# Patient Record
Sex: Female | Born: 1960 | Race: White | Hispanic: No | Marital: Married | Smoking: Never smoker
Health system: Southern US, Community
[De-identification: ages and names within clinical notes are randomized; demographics above are authoritative.]

## PROBLEM LIST (undated history)

## (undated) DIAGNOSIS — J45909 Unspecified asthma, uncomplicated: Secondary | ICD-10-CM

## (undated) DIAGNOSIS — R319 Hematuria, unspecified: Secondary | ICD-10-CM

## (undated) DIAGNOSIS — F439 Reaction to severe stress, unspecified: Secondary | ICD-10-CM

## (undated) DIAGNOSIS — Z6835 Body mass index (BMI) 35.0-35.9, adult: Secondary | ICD-10-CM

## (undated) DIAGNOSIS — R079 Chest pain, unspecified: Secondary | ICD-10-CM

## (undated) DIAGNOSIS — K649 Unspecified hemorrhoids: Secondary | ICD-10-CM

## (undated) DIAGNOSIS — E7800 Pure hypercholesterolemia, unspecified: Secondary | ICD-10-CM

## (undated) DIAGNOSIS — M722 Plantar fascial fibromatosis: Secondary | ICD-10-CM

## (undated) DIAGNOSIS — Z0000 Encounter for general adult medical examination without abnormal findings: Secondary | ICD-10-CM

## (undated) DIAGNOSIS — I10 Essential (primary) hypertension: Secondary | ICD-10-CM

## (undated) DIAGNOSIS — N951 Menopausal and female climacteric states: Secondary | ICD-10-CM

## (undated) DIAGNOSIS — M1990 Unspecified osteoarthritis, unspecified site: Secondary | ICD-10-CM

## (undated) DIAGNOSIS — Z86010 Personal history of colonic polyps: Secondary | ICD-10-CM

## (undated) DIAGNOSIS — R928 Other abnormal and inconclusive findings on diagnostic imaging of breast: Secondary | ICD-10-CM

## (undated) HISTORY — DX: Encounter for general adult medical examination without abnormal findings: Z00.00

## (undated) HISTORY — DX: Unspecified asthma, uncomplicated: J45.909

## (undated) HISTORY — DX: Plantar fascial fibromatosis: M72.2

## (undated) HISTORY — DX: Essential (primary) hypertension: I10

## (undated) HISTORY — DX: Reaction to severe stress, unspecified: F43.9

## (undated) HISTORY — DX: Unspecified osteoarthritis, unspecified site: M19.90

## (undated) HISTORY — DX: Hematuria, unspecified: R31.9

## (undated) HISTORY — DX: Chest pain, unspecified: R07.9

## (undated) HISTORY — DX: Menopausal and female climacteric states: N95.1

## (undated) HISTORY — DX: Pure hypercholesterolemia, unspecified: E78.00

## (undated) HISTORY — DX: Personal history of colonic polyps: Z86.010

## (undated) HISTORY — DX: Other abnormal and inconclusive findings on diagnostic imaging of breast: R92.8

## (undated) HISTORY — DX: Unspecified hemorrhoids: K64.9

## (undated) HISTORY — DX: Body mass index (BMI) 35.0-35.9, adult: Z68.35

---

## 1993-08-01 HISTORY — PX: TUBAL LIGATION: SHX77

## 2004-06-11 ENCOUNTER — Ambulatory Visit: Payer: Self-pay | Admitting: Internal Medicine

## 2004-08-01 HISTORY — PX: EYELID LACERATION REPAIR: SHX5333

## 2005-01-18 ENCOUNTER — Ambulatory Visit: Payer: Self-pay | Admitting: Internal Medicine

## 2006-03-21 ENCOUNTER — Ambulatory Visit: Payer: Self-pay | Admitting: Internal Medicine

## 2007-05-15 ENCOUNTER — Ambulatory Visit: Payer: Self-pay | Admitting: Internal Medicine

## 2008-06-23 ENCOUNTER — Ambulatory Visit: Payer: Self-pay | Admitting: Internal Medicine

## 2008-07-21 ENCOUNTER — Ambulatory Visit: Payer: Self-pay | Admitting: Internal Medicine

## 2010-06-01 ENCOUNTER — Ambulatory Visit: Payer: Self-pay | Admitting: Internal Medicine

## 2011-06-10 ENCOUNTER — Ambulatory Visit: Payer: Self-pay | Admitting: Internal Medicine

## 2011-08-02 HISTORY — PX: COLONOSCOPY: SHX174

## 2012-07-16 ENCOUNTER — Encounter: Payer: Self-pay | Admitting: Internal Medicine

## 2012-07-16 ENCOUNTER — Ambulatory Visit (INDEPENDENT_AMBULATORY_CARE_PROVIDER_SITE_OTHER): Payer: Self-pay | Admitting: Internal Medicine

## 2012-07-16 VITALS — BP 116/74 | HR 86 | Temp 98.4°F | Ht 62.16 in | Wt 181.5 lb

## 2012-07-16 DIAGNOSIS — E7800 Pure hypercholesterolemia, unspecified: Secondary | ICD-10-CM

## 2012-07-16 DIAGNOSIS — Z139 Encounter for screening, unspecified: Secondary | ICD-10-CM

## 2012-07-16 DIAGNOSIS — I10 Essential (primary) hypertension: Secondary | ICD-10-CM

## 2012-07-16 DIAGNOSIS — R5381 Other malaise: Secondary | ICD-10-CM

## 2012-07-16 DIAGNOSIS — R5383 Other fatigue: Secondary | ICD-10-CM

## 2012-07-16 DIAGNOSIS — N951 Menopausal and female climacteric states: Secondary | ICD-10-CM

## 2012-07-16 HISTORY — DX: Pure hypercholesterolemia, unspecified: E78.00

## 2012-07-16 MED ORDER — LEVOFLOXACIN 500 MG PO TABS: 500.0000 mg | ORAL_TABLET | Freq: Every day | ORAL | Status: DC

## 2012-07-16 MED ORDER — ALBUTEROL SULFATE HFA 108 (90 BASE) MCG/ACT IN AERS: 2.0000 | INHALATION_SPRAY | Freq: Four times a day (QID) | RESPIRATORY_TRACT | Status: DC | PRN

## 2012-07-16 MED ORDER — FLUTICASONE PROPIONATE 50 MCG/ACT NA SUSP: NASAL | Status: DC

## 2012-07-16 MED ORDER — SERTRALINE HCL 100 MG PO TABS: 100.0000 mg | ORAL_TABLET | Freq: Every day | ORAL | Status: DC

## 2012-07-16 MED ORDER — MONTELUKAST SODIUM 10 MG PO TABS: 10.0000 mg | ORAL_TABLET | Freq: Every day | ORAL | Status: DC

## 2012-07-16 MED ORDER — HYDROCHLOROTHIAZIDE 25 MG PO TABS: ORAL_TABLET | ORAL | Status: DC

## 2012-07-22 ENCOUNTER — Encounter: Payer: Self-pay | Admitting: Internal Medicine

## 2012-07-22 DIAGNOSIS — N951 Menopausal and female climacteric states: Secondary | ICD-10-CM

## 2012-07-22 DIAGNOSIS — I10 Essential (primary) hypertension: Secondary | ICD-10-CM | POA: Insufficient documentation

## 2012-07-22 HISTORY — DX: Menopausal and female climacteric states: N95.1

## 2012-07-22 NOTE — Assessment & Plan Note (Signed)
Low cholesterol diet and exercise.  Follow.   

## 2012-07-22 NOTE — Assessment & Plan Note (Signed)
On prometrium.  Follow.

## 2012-07-22 NOTE — Progress Notes (Signed)
Subjective:    Patient ID: Ashley Waller, female    DOB: March 16, 1961, 51 y.o.   MRN: [id number]  HPI 51 year old female with past history of hypercholesterolemia who comes in today to follow up on this as well as for a complete physical exam.  She states that starting eight days ago she started having increased sinus issues.  Increased drainage.  Some green and blood tinged sputum.  Feels it is moving down into her chest.  Some tightness and wheezing.  No hemoptysis.  No significant sob.  No fever, nausea or vomiting.  Taking Mucinex and tylenol.  She did get her flu shot.   Past Medical History  Diagnosis Date  . Hypertension   . Hypercholesterolemia   . Asthma   . Hematuria     Current Outpatient Prescriptions on File Prior to Visit  Medication Sig Dispense Refill  . albuterol (PROVENTIL HFA;VENTOLIN HFA) 108 (90 BASE) MCG/ACT inhaler Inhale 2 puffs into the lungs every 6 (six) hours as needed.  6.7 g  5  . fluticasone (FLONASE) 50 MCG/ACT nasal spray Two puffs in each nostril each day  16 g  5  . hydrochlorothiazide (HYDRODIURIL) 25 MG tablet Take 1/2 tablet by mouth daily  30 tablet  5  . loratadine (CLARITIN) 10 MG tablet Take 10 mg by mouth daily.      . montelukast (SINGULAIR) 10 MG tablet Take 1 tablet (10 mg total) by mouth at bedtime.  30 tablet  5  . Progesterone Micronized (PROMETRIUM PO) Take 150 mg by mouth daily.      . sertraline (ZOLOFT) 100 MG tablet Take 1 tablet (100 mg total) by mouth daily.  30 tablet  5    Review of Systems Patient denies any headache, lightheadedness or dizziness. Sinus congestion and drainage as outlined.  Some tightness and wheezing in her chest.   No chest pain or palpitations.  No increased shortness of breath.  No nausea or vomiting.  No abdominal pain or cramping.  No bowel change, such as diarrhea, constipation, BRBPR or melana.  No urine change.        Objective:   Physical Exam Filed Vitals:   07/16/12 0934  BP: 116/74  Pulse: 86   Temp: 98.4 F (90.43 C)   51 year old female in no acute distress.   HEENT:  Nares - clear.  OP- without lesions or erythema.  NECK:  Supple, nontender.  No audible bruit.   HEART:  Appears to be regular. LUNGS:  Without crackles or wheezing audible.  Respirations even and unlabored.   RADIAL PULSE:  Equal bilaterally.  ABDOMEN:  Soft, nontender.  No audible abdominal bruit.   EXTREMITIES:  No increased edema to be present.                     Assessment & Plan:  PROBABLE SINUSITIS/URI.  Will treat with Levaquin 500mg  q day x 10 days.  Saline nasal flushes and Flonase nasal spray as directed.  Albuterol inhaler as directed.  Rest.  Fluids.  Explained to her if symptoms changed, worsened or did not resolve, she was to be reevaluated.   POSTMENOPAUSAL BLEEDING.  Saw GYN ([facility]).  Had biopsy.  No pathologic finding found for her bleeding.  Has not had any reoccurrence.  Follow.    INCREASED PSYCHOSOCIAL STRESSORS.  On Zoloft.  Follow.  Stable.  Doing better.    PREVIOUS HEMATURIA.  Last u/a negative.    CARDIOVASCULAR.  Asymptomatic.  GI.  Colonoscopy 12/22/10 revealed a polyp in the cecum - otherwise normal.  Recommended follow up colonoscopy 12/22/15.    HEALTH MAINTENANCE.  Will schedule a physical next visit.  Schedule mammogram.  Colonoscopy as outlined.

## 2012-07-22 NOTE — Assessment & Plan Note (Signed)
Blood pressure under good control.  Follow metabolic panel.   

## 2012-07-23 ENCOUNTER — Telehealth: Payer: Self-pay | Admitting: Internal Medicine

## 2012-07-23 NOTE — Telephone Encounter (Signed)
Called in refill for progesterone 150mg  q day #30 with 3 refills.

## 2012-08-27 ENCOUNTER — Other Ambulatory Visit: Payer: Self-pay

## 2012-09-03 ENCOUNTER — Encounter: Payer: Self-pay | Admitting: Internal Medicine

## 2012-10-08 ENCOUNTER — Ambulatory Visit: Payer: Self-pay | Admitting: Internal Medicine

## 2012-10-15 ENCOUNTER — Ambulatory Visit: Payer: Self-pay | Admitting: Internal Medicine

## 2012-10-15 DIAGNOSIS — R928 Other abnormal and inconclusive findings on diagnostic imaging of breast: Secondary | ICD-10-CM | POA: Insufficient documentation

## 2012-10-15 HISTORY — DX: Other abnormal and inconclusive findings on diagnostic imaging of breast: R92.8

## 2012-10-18 ENCOUNTER — Telehealth: Payer: Self-pay | Admitting: General Practice

## 2012-10-18 NOTE — Telephone Encounter (Signed)
Called patient to let her know that her mammo findings were fine. Dr. Lorin Picket would like to do a follow-up breast exam on patient in the next 4-6 weeks.(needs an appointment)  Also recommends 6 month f/u for a mammogram. Left detailed message on voice mail.

## 2012-10-18 NOTE — Telephone Encounter (Signed)
Pt called wanting to know the results of a follow up Mammogram and [place]. States that she has not been home and apologizes if you have tried to call. Pleae try pt at [phone]

## 2012-11-14 ENCOUNTER — Encounter: Payer: Self-pay | Admitting: Internal Medicine

## 2012-11-14 ENCOUNTER — Ambulatory Visit (INDEPENDENT_AMBULATORY_CARE_PROVIDER_SITE_OTHER): Payer: BC Managed Care – PPO | Admitting: Internal Medicine

## 2012-11-14 VITALS — BP 110/70 | HR 66 | Temp 98.7°F | Ht 61.0 in | Wt 183.0 lb

## 2012-11-14 DIAGNOSIS — E7800 Pure hypercholesterolemia, unspecified: Secondary | ICD-10-CM

## 2012-11-14 DIAGNOSIS — I10 Essential (primary) hypertension: Secondary | ICD-10-CM

## 2012-11-14 DIAGNOSIS — R928 Other abnormal and inconclusive findings on diagnostic imaging of breast: Secondary | ICD-10-CM

## 2012-11-14 DIAGNOSIS — R5381 Other malaise: Secondary | ICD-10-CM

## 2012-11-14 DIAGNOSIS — N951 Menopausal and female climacteric states: Secondary | ICD-10-CM

## 2012-11-14 DIAGNOSIS — R5383 Other fatigue: Secondary | ICD-10-CM

## 2012-11-14 LAB — COMPREHENSIVE METABOLIC PANEL
Albumin: 4.3 g/dL (ref 3.5–5.2)
Alkaline Phosphatase: 63 U/L (ref 39–117)
CO2: 27 mEq/L (ref 19–32)
Chloride: 102 mEq/L (ref 96–112)
Glucose, Bld: 88 mg/dL (ref 70–99)
Potassium: 3.7 mEq/L (ref 3.5–5.1)
Sodium: 139 mEq/L (ref 135–145)
Total Protein: 7.1 g/dL (ref 6.0–8.3)

## 2012-11-14 LAB — CBC WITH DIFFERENTIAL/PLATELET
Eosinophils Relative: 1.6 % (ref 0.0–5.0)
Lymphocytes Relative: 39.8 % (ref 12.0–46.0)
Monocytes Relative: 7.2 % (ref 3.0–12.0)
Neutrophils Relative %: 50.5 % (ref 43.0–77.0)
Platelets: 325 10*3/uL (ref 150.0–400.0)
WBC: 5.8 10*3/uL (ref 4.5–10.5)

## 2012-11-14 LAB — TSH: TSH: 1.27 u[IU]/mL (ref 0.35–5.50)

## 2012-11-14 LAB — LDL CHOLESTEROL, DIRECT: Direct LDL: 148 mg/dL

## 2012-11-14 LAB — LIPID PANEL
Cholesterol: 221 mg/dL — ABNORMAL HIGH (ref 0–200)
Triglycerides: 168 mg/dL — ABNORMAL HIGH (ref 0.0–149.0)

## 2012-11-17 ENCOUNTER — Encounter: Payer: Self-pay | Admitting: Internal Medicine

## 2012-11-17 NOTE — Assessment & Plan Note (Addendum)
Off prometrium.  Doing relatively well.  Follow.    

## 2012-11-17 NOTE — Progress Notes (Signed)
Subjective:    Patient ID: Ashley Waller, female    DOB: Aug 19, 1960, 52 y.o.   MRN: [id number]  HPI 52 year old female with past history of hypercholesterolemia who comes in today to follow up on this as well as for a complete physical exam.  She states she is doing relatively well.  No cardiac symptoms with increased activity or exertion.  No sob.  No sinus or allergy symptoms.  No acid reflux.  No abdominal pain or cramping.  No bowel change.  She recently had a mammogram - recommended follow up views.  Follow up scan - recommended 6 month follow up.  She reports no change in her breast.  States a co-worker was just diagnosed with breast cancer.  She request a referral to see Dr Lemar Livings to discuss the mammogram findings and get a "second opinion".  Overall she feels ok.     Past Medical History  Diagnosis Date  . Hypertension   . Hypercholesterolemia   . Asthma   . Hematuria     Current Outpatient Prescriptions on File Prior to Visit  Medication Sig Dispense Refill  . albuterol (PROVENTIL HFA;VENTOLIN HFA) 108 (90 BASE) MCG/ACT inhaler Inhale 2 puffs into the lungs every 6 (six) hours as needed.  6.7 g  5  . fluticasone (FLONASE) 50 MCG/ACT nasal spray Two puffs in each nostril each day  16 g  5  . hydrochlorothiazide (HYDRODIURIL) 25 MG tablet Take 1/2 tablet by mouth daily  30 tablet  5  . loratadine (CLARITIN) 10 MG tablet Take 10 mg by mouth daily.      . montelukast (SINGULAIR) 10 MG tablet Take 1 tablet (10 mg total) by mouth at bedtime.  30 tablet  5  . sertraline (ZOLOFT) 100 MG tablet Take 1 tablet (100 mg total) by mouth daily.  30 tablet  5  . Progesterone Micronized (PROMETRIUM PO) Take 150 mg by mouth daily.       No current facility-administered medications on file prior to visit.    Review of Systems Patient denies any headache, lightheadedness or dizziness. No sinus or allergy symptoms.  No chest pain or palpitations.  No increased shortness of breath.  No acid reflux.   No nausea or vomiting.  No abdominal pain or cramping.  No bowel change, such as diarrhea, constipation, BRBPR or melana.  No urine change.  Handling stress relatively well.  Some occasional breast tenderness, but no change in her breast.  Does report some fatigue.      Objective:   Physical Exam  Filed Vitals:   11/14/12 0955  BP: 110/70  Pulse: 66  Temp: 98.7 F (48.34 C)   52 year old female in no acute distress.   HEENT:  Nares- clear.  Oropharynx - without lesions. NECK:  Supple.  Nontender.  No audible bruit.  HEART:  Appears to be regular. LUNGS:  No crackles or wheezing audible.  Respirations even and unlabored.  RADIAL PULSE:  Equal bilaterally.    BREASTS:  No nipple discharge or nipple retraction present.  Could not appreciate any distinct nodules or axillary adenopathy.  ABDOMEN:  Soft, nontender.  Bowel sounds present and normal.  No audible abdominal bruit.   EXTREMITIES:  No increased edema present.  DP pulses palpable and equal bilaterally.             Assessment & Plan:  ABNORMAL MAMMOGRAM.  Initial bilateral mammogram for 2014 recommended follow up views.  Follow up views - recommended 6  month follow up.  No change on exam.  Pt reports a co worker just diagnosed with breast cancer.  She request a referral to see Dr Lemar Livings for evaluation and a "second opinion".    POSTMENOPAUSAL BLEEDING.  Saw GYN ([facility]).  Had biopsy.  No pathologic finding found for her bleeding.  Has not had any reoccurrence.  Follow.    INCREASED PSYCHOSOCIAL STRESSORS.  On Zoloft.  Follow.  Stable.  Doing better.    PREVIOUS HEMATURIA.  Last u/a negative.    CARDIOVASCULAR.  Asymptomatic.    GI.  Colonoscopy 12/22/10 revealed a polyp in the cecum - otherwise normal.  Recommended follow up colonoscopy 12/22/15.    FATIGUE.  Does report some fatigue.  Will check cbc,metc and tsh.   HEALTH MAINTENANCE.  Physical today.  Mammogram as outlined. Colonoscopy as outlined.

## 2012-11-17 NOTE — Assessment & Plan Note (Signed)
Blood pressure under good control.  Follow metabolic panel.   

## 2012-11-17 NOTE — Assessment & Plan Note (Signed)
Low cholesterol diet and exercise.  Follow.   

## 2012-12-04 ENCOUNTER — Ambulatory Visit: Payer: Self-pay | Admitting: General Surgery

## 2012-12-07 ENCOUNTER — Encounter: Payer: Self-pay | Admitting: Internal Medicine

## 2012-12-11 ENCOUNTER — Encounter: Payer: Self-pay | Admitting: Internal Medicine

## 2012-12-27 ENCOUNTER — Ambulatory Visit: Payer: Self-pay | Admitting: General Surgery

## 2013-01-22 ENCOUNTER — Encounter: Payer: Self-pay | Admitting: General Surgery

## 2013-01-22 ENCOUNTER — Ambulatory Visit (INDEPENDENT_AMBULATORY_CARE_PROVIDER_SITE_OTHER): Payer: BC Managed Care – PPO | Admitting: General Surgery

## 2013-01-22 VITALS — BP 130/78 | HR 74 | Resp 12 | Ht 62.0 in | Wt 187.0 lb

## 2013-01-22 DIAGNOSIS — R928 Other abnormal and inconclusive findings on diagnostic imaging of breast: Secondary | ICD-10-CM

## 2013-01-22 NOTE — Patient Instructions (Addendum)
Continue self breast exams. Call office for any new breast issues or concerns.  Follow up right breast mammogram in 6 months, follow up with [facility] if needed or if new issues arise

## 2013-01-22 NOTE — Progress Notes (Signed)
Patient ID: Ashley Waller, female   DOB: [birth year], 52 y.o.   MRN: [id number]  Chief Complaint  Patient presents with  . Other    mammogram    HPI Ashley Waller is a 52 y.o. female Who presents for a breast evaluation referred by Dr. Lorin Picket. The most recent mammogram was done on 10/15/12 cat 4, ultrasound done as well. Patient does perform regular self breast checks and gets regular mammograms done. No family history of breast cancer. States she "can't feel anything" until she "knew where to feel for it".  Denies any breast trauma. She does states she has noticied few knots on her left thigh. She has noticed a small weight gain.  The patient is concerned as she has two co-workers at [employer] who had recently been diagnosed with breast cancer. HPI  Past Medical History  Diagnosis Date  . Hypertension   . Asthma   . Hematuria   . Arthritis   . Hemorrhoid     Past Surgical History  Procedure Laterality Date  . Tubal ligation  1995  . Colonoscopy  2013  . Eyelid laceration repair  2006    Family History  Problem Relation Age of Onset  . Hyperlipidemia Mother   . Diabetes Mother   . Hypertension Mother   . Hyperlipidemia Father   . Diabetes Father   . Hypertension Father   . Mental illness Maternal Grandmother   . Mental illness Maternal Grandfather   . Mental illness Paternal Grandmother   . Mental illness Paternal Grandfather     Social History History  Substance Use Topics  . Smoking status: Never Smoker   . Smokeless tobacco: Never Used  . Alcohol Use: 1.0 oz/week    2 drink(s) per week     Comment: ocassionally    Allergies  Allergen Reactions  . Penicillins     Current Outpatient Prescriptions  Medication Sig Dispense Refill  . acetaminophen (TYLENOL) 325 MG tablet Take 650 mg by mouth every 6 (six) hours as needed for pain.      Marland Kitchen albuterol (PROVENTIL HFA;VENTOLIN HFA) 108 (90 BASE) MCG/ACT inhaler Inhale 2 puffs into the lungs every 6 (six) hours as  needed.  6.7 g  5  . fluticasone (FLONASE) 50 MCG/ACT nasal spray Two puffs in each nostril each day  16 g  5  . hydrochlorothiazide (HYDRODIURIL) 25 MG tablet Take 1/2 tablet by mouth daily  30 tablet  5  . loratadine (CLARITIN) 10 MG tablet Take 10 mg by mouth daily.      . montelukast (SINGULAIR) 10 MG tablet Take 1 tablet (10 mg total) by mouth at bedtime.  30 tablet  5  . sertraline (ZOLOFT) 100 MG tablet Take 1 tablet (100 mg total) by mouth daily.  30 tablet  5   No current facility-administered medications for this visit.    Review of Systems Review of Systems  Constitutional: Negative.   Respiratory: Negative.   Cardiovascular: Negative.     Blood pressure 130/78, pulse 74, resp. rate 12, height 5\' 2"  (1.575 m), weight 187 lb (84.823 kg), last menstrual period 07/16/2009.  Physical Exam Physical Exam  Constitutional: She is oriented to person, place, and time. She appears well-developed and well-nourished.  Cardiovascular: Normal rate and regular rhythm.   Pulmonary/Chest: Effort normal and breath sounds normal. Right breast exhibits no inverted nipple, no mass, no nipple discharge, no skin change and no tenderness. Left breast exhibits no inverted nipple, no mass, no nipple discharge,  no skin change and no tenderness.  Lymphadenopathy:    She has no cervical adenopathy.    She has no axillary adenopathy.  Neurological: She is alert and oriented to person, place, and time.  Skin: Skin is warm and dry.  Multiple sub cm nodules anterior thigh left > right consistent with lipomas.    Data Reviewed Mammograms and ultrasound for March 2014 were reviewed. Ultrasound showed multiple hyperechoic lesions in the axilla measuring up to 11 mm thought to represent normal lymph nodes. Six-month reassessment with mammography was recommended.  Assessment    Benign clinical exam. Negative mammogram. Newly identified axillary lymph nodes likely secondary to improve technique.     Plan    I see no evidence of primary breast pathology. Recommendation for followup mammogram in September 2014 is reasonable. This will be deferred to her primary care physician. She is welcome to return at a time if there is persistent questions regarding the normal size x-ray nose recently identified Raney other breast lesions.       Ashley Waller 01/23/2013, 6:55 AM

## 2013-01-23 ENCOUNTER — Encounter: Payer: Self-pay | Admitting: General Surgery

## 2013-02-18 ENCOUNTER — Encounter: Payer: Self-pay | Admitting: Internal Medicine

## 2013-02-18 ENCOUNTER — Ambulatory Visit (INDEPENDENT_AMBULATORY_CARE_PROVIDER_SITE_OTHER): Payer: BC Managed Care – PPO | Admitting: Internal Medicine

## 2013-02-18 VITALS — BP 110/70 | HR 77 | Temp 98.6°F | Ht 61.0 in | Wt 184.5 lb

## 2013-02-18 DIAGNOSIS — N951 Menopausal and female climacteric states: Secondary | ICD-10-CM

## 2013-02-18 DIAGNOSIS — E7800 Pure hypercholesterolemia, unspecified: Secondary | ICD-10-CM

## 2013-02-18 DIAGNOSIS — I10 Essential (primary) hypertension: Secondary | ICD-10-CM

## 2013-02-18 DIAGNOSIS — R928 Other abnormal and inconclusive findings on diagnostic imaging of breast: Secondary | ICD-10-CM

## 2013-02-18 NOTE — Assessment & Plan Note (Signed)
Blood pressure under good control.  Follow metabolic panel.   

## 2013-02-18 NOTE — Assessment & Plan Note (Signed)
Last mammogram 10/15/12 (and ultrasound).  Recommended a f/u mammogram in 9/14.  Pt declines.  Wants to monitor.

## 2013-02-18 NOTE — Assessment & Plan Note (Signed)
Low cholesterol diet and exercise.  Follow.   

## 2013-02-18 NOTE — Progress Notes (Signed)
Subjective:    Patient ID: Ashley Waller, female    DOB: Jun 16, 1961, 52 y.o.   MRN: [id number]  HPI 52 year old female with past history of hypercholesterolemia who comes in today for a scheduled follow up.  She states she is doing relatively well.  No cardiac symptoms with increased activity or exertion.  No sob.  No sinus or allergy symptoms.  No acid reflux.  No abdominal pain or cramping.  No bowel change.  She recently had a mammogram - recommended follow up views.  Follow up scan - recommended 6 month follow up.  She reports no change in her breast.  She saw Dr Lemar Livings.  Recommended a f/u mammogram in 9/14.  Discussed with her today.  She declined scan in 9/14.  Discussed need for monitoring diet and following cholesterol.      Past Medical History  Diagnosis Date  . Hypertension   . Asthma   . Hematuria   . Arthritis   . Hemorrhoid     Current Outpatient Prescriptions on File Prior to Visit  Medication Sig Dispense Refill  . acetaminophen (TYLENOL) 325 MG tablet Take 650 mg by mouth every 6 (six) hours as needed for pain.      Marland Kitchen albuterol (PROVENTIL HFA;VENTOLIN HFA) 108 (90 BASE) MCG/ACT inhaler Inhale 2 puffs into the lungs every 6 (six) hours as needed.  6.7 g  5  . fluticasone (FLONASE) 50 MCG/ACT nasal spray Two puffs in each nostril each day  16 g  5  . hydrochlorothiazide (HYDRODIURIL) 25 MG tablet Take 1/2 tablet by mouth daily  30 tablet  5  . loratadine (CLARITIN) 10 MG tablet Take 10 mg by mouth daily.      . montelukast (SINGULAIR) 10 MG tablet Take 1 tablet (10 mg total) by mouth at bedtime.  30 tablet  5  . sertraline (ZOLOFT) 100 MG tablet Take 1 tablet (100 mg total) by mouth daily.  30 tablet  5   No current facility-administered medications on file prior to visit.    Review of Systems Patient denies any headache, lightheadedness or dizziness. No sinus or allergy symptoms.  No chest pain or palpitations.  No increased shortness of breath.  No acid reflux.  No  nausea or vomiting.  No abdominal pain or cramping.  No bowel change, such as diarrhea, constipation, BRBPR or melana.  No urine change.  Handling stress relatively well. Overall she feels she is doing well.      Objective:   Physical Exam  Filed Vitals:   02/18/13 0942  BP: 110/70  Pulse: 77  Temp: 98.6 F (33 C)   52 year old female in no acute distress.   HEENT:  Nares- clear.  Oropharynx - without lesions. NECK:  Supple.  Nontender.  No audible bruit.  HEART:  Appears to be regular. LUNGS:  No crackles or wheezing audible.  Respirations even and unlabored.  RADIAL PULSE:  Equal bilaterally. ABDOMEN:  Soft, nontender.  Bowel sounds present and normal.  No audible abdominal bruit.   EXTREMITIES:  No increased edema present.  DP pulses palpable and equal bilaterally.             Assessment & Plan:  ABNORMAL MAMMOGRAM.  Initial bilateral mammogram for 2014 recommended follow up views.  Follow up views - recommended 6 month follow up.  (last mammogram and ultrasound 10/15/12).  Saw Dr Lemar Livings.  Recommended a f/u mammogram in 9/14.  Pt declines.  Wants to monitor.  POSTMENOPAUSAL BLEEDING.  Saw GYN ([facility]).  Had biopsy.  No pathologic finding found for her bleeding.  Has not had any reoccurrence.  Follow.    INCREASED PSYCHOSOCIAL STRESSORS.  On Zoloft.  Follow.  Stable.  Doing better.    PREVIOUS HEMATURIA.  Last u/a negative.    CARDIOVASCULAR.  Asymptomatic.    GI.  Colonoscopy 12/22/10 revealed a polyp in the cecum - otherwise normal.  Recommended follow up colonoscopy 12/22/15.  HEALTH MAINTENANCE.  Physical 11/14/12.  Mammogram as outlined. Colonoscopy as outlined.

## 2013-02-18 NOTE — Assessment & Plan Note (Signed)
Off prometrium.  Doing relatively well.  Follow.    

## 2013-03-05 ENCOUNTER — Encounter: Payer: Self-pay | Admitting: Internal Medicine

## 2013-03-15 ENCOUNTER — Other Ambulatory Visit: Payer: Self-pay | Admitting: Internal Medicine

## 2013-05-15 ENCOUNTER — Telehealth: Payer: Self-pay | Admitting: Internal Medicine

## 2013-05-15 NOTE — Telephone Encounter (Signed)
Heather from [facility] called stating the patient needed a 6 month f/u to check her R breast with the nodular area that was found on her imaging prior. I called the patient to make her aware of the apt I scheduled with [facility]. Pt refused apt. She does not want to proceed.

## 2013-05-16 NOTE — Telephone Encounter (Signed)
noted 

## 2013-05-16 NOTE — Telephone Encounter (Signed)
See my office note.  Pt declines f/u mammo.  Will discuss with her again at her upcoming appt.

## 2013-07-15 ENCOUNTER — Other Ambulatory Visit (INDEPENDENT_AMBULATORY_CARE_PROVIDER_SITE_OTHER): Payer: BC Managed Care – PPO

## 2013-07-15 ENCOUNTER — Encounter: Payer: Self-pay | Admitting: Internal Medicine

## 2013-07-15 ENCOUNTER — Other Ambulatory Visit: Payer: Self-pay | Admitting: Internal Medicine

## 2013-07-15 ENCOUNTER — Ambulatory Visit (INDEPENDENT_AMBULATORY_CARE_PROVIDER_SITE_OTHER): Payer: BC Managed Care – PPO | Admitting: Internal Medicine

## 2013-07-15 VITALS — BP 110/70 | HR 76 | Temp 98.4°F | Ht 61.0 in | Wt 183.2 lb

## 2013-07-15 DIAGNOSIS — N951 Menopausal and female climacteric states: Secondary | ICD-10-CM

## 2013-07-15 DIAGNOSIS — R928 Other abnormal and inconclusive findings on diagnostic imaging of breast: Secondary | ICD-10-CM

## 2013-07-15 DIAGNOSIS — I10 Essential (primary) hypertension: Secondary | ICD-10-CM

## 2013-07-15 DIAGNOSIS — M722 Plantar fascial fibromatosis: Secondary | ICD-10-CM

## 2013-07-15 DIAGNOSIS — E7800 Pure hypercholesterolemia, unspecified: Secondary | ICD-10-CM

## 2013-07-15 HISTORY — DX: Plantar fascial fibromatosis: M72.2

## 2013-07-15 LAB — COMPREHENSIVE METABOLIC PANEL
ALT: 15 U/L (ref 0–35)
AST: 17 U/L (ref 0–37)
Albumin: 4.5 g/dL (ref 3.5–5.2)
CO2: 29 mEq/L (ref 19–32)
Calcium: 9.3 mg/dL (ref 8.4–10.5)
Chloride: 101 mEq/L (ref 96–112)
GFR: 104.32 mL/min (ref >60.00)
Potassium: 3.8 mEq/L (ref 3.5–5.1)
Sodium: 138 mEq/L (ref 135–145)
Total Protein: 7.3 g/dL (ref 6.0–8.3)

## 2013-07-15 LAB — LIPID PANEL
Cholesterol: 245 mg/dL — ABNORMAL HIGH (ref 0–200)
Total CHOL/HDL Ratio: 5

## 2013-07-15 NOTE — Assessment & Plan Note (Addendum)
Off prometrium.  Doing relatively well.  Follow.    

## 2013-07-15 NOTE — Assessment & Plan Note (Addendum)
Blood pressure under good control.  Follow metabolic panel.   

## 2013-07-15 NOTE — Assessment & Plan Note (Addendum)
Last mammogram 10/15/12 (and ultrasound).  Recommended a f/u mammogram in 9/14.  Pt declined.  Wanted to monitor.  Agreed to bilateral mammogram in 3/15.  Diagnostic mammogram ordered.

## 2013-07-15 NOTE — Progress Notes (Signed)
Subjective:    Patient ID: Ashley Waller, female    DOB: Jul 13, 1961, 52 y.o.   MRN: [id number]  HPI 52 year old female with past history of hypercholesterolemia who comes in today for a scheduled follow up.  She states she is doing relatively well.  No cardiac symptoms with increased activity or exertion.  No sob.  No sinus or allergy symptoms.  No acid reflux.  No abdominal pain or cramping.  No bowel change.  Last mammogram - recommended follow up views.  Follow up scan - recommended 6 month follow up.  She reports no change in her breast.  She saw Dr Lemar Livings.  Recommended a f/u mammogram in 9/14.  She declined scan in 9/14.  Agreed to f/u scan in 3/15.  Some mucus in her throat.  Just started this am.  No other symptoms.  Had problems with plantar fasciitis - left foot.  Saw Dr Alberteen Spindle.  Injection helped.  Doing stretches.      Past Medical History  Diagnosis Date  . Hypertension   . Asthma   . Hematuria   . Arthritis   . Hemorrhoid     Current Outpatient Prescriptions on File Prior to Visit  Medication Sig Dispense Refill  . acetaminophen (TYLENOL) 325 MG tablet Take 650 mg by mouth every 6 (six) hours as needed for pain.      Marland Kitchen albuterol (PROVENTIL HFA;VENTOLIN HFA) 108 (90 BASE) MCG/ACT inhaler Inhale 2 puffs into the lungs every 6 (six) hours as needed.  6.7 g  5  . fluticasone (FLONASE) 50 MCG/ACT nasal spray Two puffs in each nostril each day  16 g  5  . hydrochlorothiazide (HYDRODIURIL) 25 MG tablet Take 1/2 tablet by mouth daily  30 tablet  5  . loratadine (CLARITIN) 10 MG tablet Take 10 mg by mouth daily.      . montelukast (SINGULAIR) 10 MG tablet TAKE ONE TABLET AT BEDTIME  30 tablet  5  . sertraline (ZOLOFT) 100 MG tablet Take 1 tablet (100 mg total) by mouth daily.  30 tablet  5   No current facility-administered medications on file prior to visit.    Review of Systems Patient denies any headache, lightheadedness or dizziness. No sinus or allergy symptoms.  No chest pain or  palpitations.  No increased shortness of breath.  No acid reflux.  No nausea or vomiting.  No abdominal pain or cramping.  No bowel change, such as diarrhea, constipation, BRBPR or melana.  No urine change.  Handling stress relatively well. Overall she feels she is doing well.      Objective:   Physical Exam  Filed Vitals:   07/15/13 0920  BP: 110/70  Pulse: 76  Temp: 98.4 F (36.9 C)   Blood pressure recheck:  118/78, pulse 97  52 year old female in no acute distress.   HEENT:  Nares- clear.  Oropharynx - without lesions. NECK:  Supple.  Nontender.  No audible bruit.  HEART:  Appears to be regular. LUNGS:  No crackles or wheezing audible.  Respirations even and unlabored.  RADIAL PULSE:  Equal bilaterally. ABDOMEN:  Soft, nontender.  Bowel sounds present and normal.  No audible abdominal bruit.   EXTREMITIES:  No increased edema present.  DP pulses palpable and equal bilaterally.             Assessment & Plan:  ABNORMAL MAMMOGRAM.  Initial bilateral mammogram for 2014 recommended follow up views.  Follow up views - recommended 6 month follow  up.  (last mammogram and ultrasound 10/15/12).  Saw Dr Lemar Livings.  Recommended a f/u mammogram in 9/14.  Pt declined.  Wanted to monitor.  Agreeable to f/u mammogram (bilateral) in 3/15.  Ordered.   POSTMENOPAUSAL BLEEDING.  Saw GYN ([facility]).  Had biopsy.  No pathologic finding found for her bleeding.  Has not had any reoccurrence.  Follow.    INCREASED PSYCHOSOCIAL STRESSORS.  On Zoloft.  Follow.  Stable.  Doing better.    PREVIOUS HEMATURIA.  Last u/a negative.    CARDIOVASCULAR.  Asymptomatic.    GI.  Colonoscopy 12/22/10 revealed a polyp in the cecum - otherwise normal.  Recommended follow up colonoscopy 12/22/15.  HEALTH MAINTENANCE.  Physical 11/14/12.  Mammogram as outlined.  Colonoscopy as outlined.

## 2013-07-15 NOTE — Assessment & Plan Note (Signed)
Saw Dr Alberteen Spindle.  Had injection.  Doing better.  Follow.

## 2013-07-15 NOTE — Progress Notes (Signed)
Pre-visit discussion using our clinic review tool. No additional management support is needed unless otherwise documented below in the visit note.  

## 2013-07-15 NOTE — Progress Notes (Signed)
Orders placed for f/u labs.  

## 2013-07-15 NOTE — Assessment & Plan Note (Addendum)
Low cholesterol diet and exercise.  Follow.   

## 2013-07-23 ENCOUNTER — Other Ambulatory Visit: Payer: Self-pay | Admitting: Internal Medicine

## 2013-09-27 ENCOUNTER — Other Ambulatory Visit: Payer: Self-pay | Admitting: Internal Medicine

## 2013-10-03 ENCOUNTER — Other Ambulatory Visit: Payer: Self-pay | Admitting: Internal Medicine

## 2013-10-21 ENCOUNTER — Ambulatory Visit: Payer: Self-pay | Admitting: Internal Medicine

## 2013-10-21 ENCOUNTER — Encounter: Payer: Self-pay | Admitting: Internal Medicine

## 2013-10-21 LAB — HM MAMMOGRAPHY: HM Mammogram: NEGATIVE

## 2013-11-18 ENCOUNTER — Encounter: Payer: Self-pay | Admitting: Internal Medicine

## 2013-11-18 ENCOUNTER — Ambulatory Visit (INDEPENDENT_AMBULATORY_CARE_PROVIDER_SITE_OTHER): Payer: BC Managed Care – PPO | Admitting: Internal Medicine

## 2013-11-18 VITALS — BP 110/80 | HR 71 | Temp 98.3°F | Ht 61.0 in | Wt 184.8 lb

## 2013-11-18 DIAGNOSIS — R928 Other abnormal and inconclusive findings on diagnostic imaging of breast: Secondary | ICD-10-CM

## 2013-11-18 DIAGNOSIS — I10 Essential (primary) hypertension: Secondary | ICD-10-CM

## 2013-11-18 DIAGNOSIS — N951 Menopausal and female climacteric states: Secondary | ICD-10-CM

## 2013-11-18 DIAGNOSIS — E7800 Pure hypercholesterolemia, unspecified: Secondary | ICD-10-CM

## 2013-11-18 NOTE — Progress Notes (Signed)
Subjective:    Patient ID: Ashley Waller, female    DOB: 1961-05-02, 54 y.o.   MRN: [id number]  HPI 53 year old female with past history of hypercholesterolemia who comes in today for her complete physical exam.  She states she is doing relatively well.  No cardiac symptoms with increased activity or exertion.  No sob.  No sinus or allergy symptoms.  No acid reflux.  No abdominal pain or cramping.  No bowel change.  Last mammogram - recommended follow up views.  Follow up scan - recommended 6 month follow up.  She declined scan in 9/14.  Agreed to f/u scan in 3/15.  This checked out fine.     Past Medical History  Diagnosis Date  . Hypertension   . Asthma   . Hematuria   . Arthritis   . Hemorrhoid     Current Outpatient Prescriptions on File Prior to Visit  Medication Sig Dispense Refill  . acetaminophen (TYLENOL) 325 MG tablet Take 650 mg by mouth every 6 (six) hours as needed for pain.      Marland Kitchen albuterol (PROVENTIL HFA;VENTOLIN HFA) 108 (90 BASE) MCG/ACT inhaler Inhale 2 puffs into the lungs every 6 (six) hours as needed.  6.7 g  5  . fluticasone (FLONASE) 50 MCG/ACT nasal spray TWO PUFFS IN EACH NOSTRIL ONCE A DAY  16 g  5  . hydrochlorothiazide (HYDRODIURIL) 25 MG tablet TAKE ONE-HALF (1/2) TABLET DAILY  30 tablet  5  . loratadine (CLARITIN) 10 MG tablet Take 10 mg by mouth daily.      . montelukast (SINGULAIR) 10 MG tablet TAKE ONE TABLET AT BEDTIME  30 tablet  5  . sertraline (ZOLOFT) 100 MG tablet TAKE ONE (1) TABLET EACH DAY  30 tablet  2   No current facility-administered medications on file prior to visit.    Review of Systems Patient denies any headache, lightheadedness or dizziness. No sinus or allergy symptoms.  No chest pain or palpitations. No increased shortness of breath.  No acid reflux.  No nausea or vomiting.  No abdominal pain or cramping.  No bowel change, such as diarrhea, constipation, BRBPR or melana.  No urine change.  Handling stress relatively well. Overall she  feels she is doing well.      Objective:   Physical Exam  Filed Vitals:   11/18/13 1330  BP: 110/80  Pulse: 71  Temp: 98.3 F (80.39 C)   53 year old female in no acute distress.   HEENT:  Nares- clear.  Oropharynx - without lesions. NECK:  Supple.  Nontender.  No audible bruit.  HEART:  Appears to be regular. LUNGS:  No crackles or wheezing audible.  Respirations even and unlabored.  RADIAL PULSE:  Equal bilaterally.    BREASTS:  No nipple discharge or nipple retraction present.  Could not appreciate any distinct nodules or axillary adenopathy.  ABDOMEN:  Soft, nontender.  Bowel sounds present and normal.  No audible abdominal bruit.  GU:  Not performed.     EXTREMITIES:  No increased edema present.  DP pulses palpable and equal bilaterally.             Assessment & Plan:  ABNORMAL MAMMOGRAM.  Initial bilateral mammogram for 2014 recommended follow up views.  Follow up views - recommended 6 month follow up.  (last mammogram and ultrasound 10/15/12).  Saw Dr Bary Castilla.  Recommended a f/u mammogram in 9/14.  Pt declined.  Wanted to monitor.  Agreeable to f/u mammogram (bilateral) in  3/15.  3/15 mammogram - ok.    POSTMENOPAUSAL BLEEDING.  Saw GYN ([facility]).  Had biopsy.  No pathologic finding found for her bleeding.  Has not had any reoccurrence.  Follow.    INCREASED PSYCHOSOCIAL STRESSORS.  On Zoloft.  Follow.  Stable.  Doing better.    PREVIOUS HEMATURIA.  Last u/a negative.    CARDIOVASCULAR.  Asymptomatic.    GI.  Colonoscopy 12/22/10 revealed a polyp in the cecum - otherwise normal.  Recommended follow up colonoscopy 12/22/15.  HEALTH MAINTENANCE.  Physical today.  Mammogram as outlined.  Colonoscopy as outlined.

## 2013-11-20 ENCOUNTER — Encounter: Payer: Self-pay | Admitting: Internal Medicine

## 2013-11-20 NOTE — Assessment & Plan Note (Signed)
Blood pressure under good control.  Follow metabolic panel.   

## 2013-11-20 NOTE — Assessment & Plan Note (Signed)
Off prometrium.  Doing relatively well.  Follow.    

## 2013-11-20 NOTE — Assessment & Plan Note (Signed)
Last mammogram 10/15/12 (and ultrasound).  Recommended a f/u mammogram in 9/14.  Pt declined.  Wanted to monitor.  Agreed to bilateral mammogram in 3/15.  Diagnostic mammogram 3/15 - ok.

## 2013-11-20 NOTE — Assessment & Plan Note (Signed)
Low cholesterol diet and exercise.  Follow.   

## 2013-11-27 ENCOUNTER — Other Ambulatory Visit: Payer: Self-pay | Admitting: Internal Medicine

## 2014-02-26 ENCOUNTER — Other Ambulatory Visit: Payer: BC Managed Care – PPO

## 2014-03-14 ENCOUNTER — Other Ambulatory Visit: Payer: Self-pay | Admitting: Internal Medicine

## 2014-03-24 ENCOUNTER — Ambulatory Visit: Payer: BC Managed Care – PPO | Admitting: Internal Medicine

## 2014-05-16 ENCOUNTER — Ambulatory Visit (INDEPENDENT_AMBULATORY_CARE_PROVIDER_SITE_OTHER): Payer: BC Managed Care – PPO | Admitting: Internal Medicine

## 2014-05-16 ENCOUNTER — Encounter: Payer: Self-pay | Admitting: Internal Medicine

## 2014-05-16 VITALS — BP 120/70 | HR 84 | Temp 98.4°F | Ht 61.0 in | Wt 189.0 lb

## 2014-05-16 DIAGNOSIS — F439 Reaction to severe stress, unspecified: Secondary | ICD-10-CM

## 2014-05-16 DIAGNOSIS — E669 Obesity, unspecified: Secondary | ICD-10-CM

## 2014-05-16 DIAGNOSIS — N951 Menopausal and female climacteric states: Secondary | ICD-10-CM

## 2014-05-16 DIAGNOSIS — E7800 Pure hypercholesterolemia, unspecified: Secondary | ICD-10-CM

## 2014-05-16 DIAGNOSIS — Z658 Other specified problems related to psychosocial circumstances: Secondary | ICD-10-CM

## 2014-05-16 DIAGNOSIS — R928 Other abnormal and inconclusive findings on diagnostic imaging of breast: Secondary | ICD-10-CM

## 2014-05-16 DIAGNOSIS — I10 Essential (primary) hypertension: Secondary | ICD-10-CM

## 2014-05-16 NOTE — Progress Notes (Signed)
Pre visit review using our clinic review tool, if applicable. No additional management support is needed unless otherwise documented below in the visit note. 

## 2014-05-17 ENCOUNTER — Encounter: Payer: Self-pay | Admitting: Internal Medicine

## 2014-05-17 ENCOUNTER — Other Ambulatory Visit: Payer: Self-pay | Admitting: Internal Medicine

## 2014-05-17 DIAGNOSIS — F439 Reaction to severe stress, unspecified: Secondary | ICD-10-CM | POA: Insufficient documentation

## 2014-05-17 HISTORY — DX: Reaction to severe stress, unspecified: F43.9

## 2014-05-17 MED ORDER — MONTELUKAST SODIUM 10 MG PO TABS: 10.0000 mg | ORAL_TABLET | Freq: Every day | ORAL | Status: DC

## 2014-05-17 MED ORDER — SERTRALINE HCL 100 MG PO TABS: 100.0000 mg | ORAL_TABLET | Freq: Every day | ORAL | Status: DC

## 2014-05-17 MED ORDER — HYDROCHLOROTHIAZIDE 25 MG PO TABS: ORAL_TABLET | ORAL | Status: DC

## 2014-05-17 NOTE — Assessment & Plan Note (Signed)
Off prometrium.  Doing relatively well.  Follow.

## 2014-05-17 NOTE — Assessment & Plan Note (Signed)
Increased stress as outlined.  Father recently passed.  Mother in [place].  Feels she is handling things relatively well.  Follow.

## 2014-05-17 NOTE — Assessment & Plan Note (Signed)
Low cholesterol diet and exercise.  Follow.   

## 2014-05-17 NOTE — Progress Notes (Signed)
Subjective:    Patient ID: Ashley Waller, female    DOB: 09-05-1960, 53 y.o.   MRN: [id number]  HPI 53 year old female with past history of hypercholesterolemia who comes in today for a scheduled follow up.  She states she is doing relatively well.  Has been under increased stress recently.  Father recently passed away.  Mother is now in [place].  Increased stress related to this.  Overall she feels she is coping relatively well.  Has good support.  No cardiac symptoms with increased activity or exertion.  No sob.  No sinus or allergy symptoms.  No acid reflux.  No abdominal pain or cramping.  No bowel change.  Last mammogram - recommended follow up views.  Follow up scan - recommended 6 month follow up.  She declined scan in 9/14.  Agreed to f/u scan in 3/15. 10/21/13 mammogram - [facility] I   Past Medical History  Diagnosis Date  . Hypertension   . Asthma   . Hematuria   . Arthritis   . Hemorrhoid     Current Outpatient Prescriptions on File Prior to Visit  Medication Sig Dispense Refill  . acetaminophen (TYLENOL) 325 MG tablet Take 650 mg by mouth every 6 (six) hours as needed for pain.      Marland Kitchen albuterol (PROVENTIL HFA;VENTOLIN HFA) 108 (90 BASE) MCG/ACT inhaler Inhale 2 puffs into the lungs every 6 (six) hours as needed.  6.7 g  5  . aspirin 81 MG tablet Take 81 mg by mouth daily.      . fluticasone (FLONASE) 50 MCG/ACT nasal spray TWO PUFFS IN EACH NOSTRIL ONCE A DAY  16 g  5  . hydrochlorothiazide (HYDRODIURIL) 25 MG tablet TAKE ONE-HALF (1/2) TABLET DAILY  30 tablet  5  . loratadine (CLARITIN) 10 MG tablet Take 10 mg by mouth daily.      . montelukast (SINGULAIR) 10 MG tablet TAKE ONE TABLET AT BEDTIME  30 tablet  5  . sertraline (ZOLOFT) 100 MG tablet TAKE ONE (1) TABLET EACH DAY  30 tablet  2   No current facility-administered medications on file prior to visit.    Review of Systems Patient denies any headache, lightheadedness or dizziness. No sinus or allergy symptoms.  No  chest pain or palpitations. No increased shortness of breath.  No acid reflux.  No nausea or vomiting.  No abdominal pain or cramping.  No bowel change, such as diarrhea, constipation, BRBPR or melana.  No urine change.  Handling stress relatively well. Overall she feels she is doing relatively well.  Sleeping better.       Objective:   Physical Exam  Filed Vitals:   05/16/14 1003  BP: 120/70  Pulse: 84  Temp: 98.4 F (27.74 C)   53 year old female in no acute distress.   HEENT:  Nares- clear.  Oropharynx - without lesions. NECK:  Supple.  Nontender.  No audible bruit.  HEART:  Appears to be regular. LUNGS:  No crackles or wheezing audible.  Respirations even and unlabored.  RADIAL PULSE:  Equal bilaterally.  ABDOMEN:  Soft, nontender.  Bowel sounds present and normal.  No audible abdominal bruit.  EXTREMITIES:  No increased edema present.  DP pulses palpable and equal bilaterally.             Assessment & Plan:  ABNORMAL MAMMOGRAM.  Initial bilateral mammogram for 2014 recommended follow up views.  Follow up views - recommended 6 month follow up.  (last mammogram and  ultrasound 10/15/12).  Saw Dr Bary Castilla.  Recommended a f/u mammogram in 9/14.  Pt declined.  Wanted to monitor.  Agreeable to f/u mammogram (bilateral) in 3/15.  3/15 mammogram - ok.  Recommended f/u mammogram in one year.  POSTMENOPAUSAL BLEEDING.  Saw GYN ([facility]).  Had biopsy.  No pathologic finding found for her bleeding.  Has not had any reoccurrence.  Follow.    INCREASED PSYCHOSOCIAL STRESSORS.  On Zoloft.  Follow.  Stable.  Doing better.    PREVIOUS HEMATURIA.  Last u/a negative.    GI.  Colonoscopy 12/22/10 revealed a polyp in the cecum - otherwise normal.  Recommended follow up colonoscopy 12/22/15.  HEALTH MAINTENANCE.  Physical 11/18/13.  Mammogram as outlined.  Colonoscopy as outlined.    Problem List Items Addressed This Visit   Abnormal mammogram     Last mammogram 10/15/12 (and ultrasound).  Recommended a  f/u mammogram in 9/14.  Pt declined.  Wanted to monitor.  Agreed to bilateral mammogram in 3/15.  Diagnostic mammogram 3/15 - ok.  Schedule f/u mammogram in 09/2014.       Hypercholesterolemia     Low cholesterol diet and exercise.  Follow.       Hypertension - Primary     Blood pressure under good control.  Follow metabolic panel.      Menopausal syndrome     Off prometrium.  Doing relatively well.  Follow.       Obesity (BMI 35.0-39.9 without comorbidity)   Stress     Increased stress as outlined.  Father recently passed.  Mother in [place].  Feels she is handling things relatively well.  Follow.         I spent 25 minutes with the patient and more than 50% of the time was spent in consultation regarding the above.

## 2014-05-17 NOTE — Assessment & Plan Note (Signed)
Blood pressure under good control.  Follow metabolic panel.

## 2014-05-17 NOTE — Progress Notes (Signed)
Medications refill

## 2014-05-17 NOTE — Assessment & Plan Note (Signed)
Last mammogram 10/15/12 (and ultrasound).  Recommended a f/u mammogram in 9/14.  Pt declined.  Wanted to monitor.  Agreed to bilateral mammogram in 3/15.  Diagnostic mammogram 3/15 - ok.  Schedule f/u mammogram in 09/2014.

## 2014-05-19 ENCOUNTER — Other Ambulatory Visit (INDEPENDENT_AMBULATORY_CARE_PROVIDER_SITE_OTHER): Payer: BC Managed Care – PPO

## 2014-05-19 DIAGNOSIS — E7800 Pure hypercholesterolemia, unspecified: Secondary | ICD-10-CM

## 2014-05-19 DIAGNOSIS — I10 Essential (primary) hypertension: Secondary | ICD-10-CM

## 2014-05-19 DIAGNOSIS — N951 Menopausal and female climacteric states: Secondary | ICD-10-CM

## 2014-05-19 LAB — TSH: TSH: 2.22 u[IU]/mL (ref 0.35–4.50)

## 2014-05-19 LAB — COMPREHENSIVE METABOLIC PANEL
ALBUMIN: 3.7 g/dL (ref 3.5–5.2)
ALT: 11 U/L (ref 0–35)
AST: 17 U/L (ref 0–37)
Alkaline Phosphatase: 64 U/L (ref 39–117)
BUN: 9 mg/dL (ref 6–23)
CO2: 32 meq/L (ref 19–32)
Calcium: 9.4 mg/dL (ref 8.4–10.5)
Chloride: 102 mEq/L (ref 96–112)
Creatinine, Ser: 0.7 mg/dL (ref 0.4–1.2)
GFR: 96.87 mL/min (ref >60.00)
GLUCOSE: 110 mg/dL — AB (ref 70–99)
Potassium: 3.2 mEq/L — ABNORMAL LOW (ref 3.5–5.1)
Sodium: 142 mEq/L (ref 135–145)
TOTAL PROTEIN: 7.3 g/dL (ref 6.0–8.3)
Total Bilirubin: 0.7 mg/dL (ref 0.2–1.2)

## 2014-05-19 LAB — CBC WITH DIFFERENTIAL/PLATELET
Basophils Absolute: 0 10*3/uL (ref 0.0–0.1)
Basophils Relative: 0.4 % (ref 0.0–3.0)
EOS PCT: 1.7 % (ref 0.0–5.0)
Eosinophils Absolute: 0.1 10*3/uL (ref 0.0–0.7)
HCT: 41 % (ref 36.0–46.0)
Hemoglobin: 13.6 g/dL (ref 12.0–15.0)
Lymphocytes Relative: 40.3 % (ref 12.0–46.0)
Lymphs Abs: 2.2 10*3/uL (ref 0.7–4.0)
MCHC: 33.1 g/dL (ref 30.0–36.0)
MCV: 90.9 fl (ref 78.0–100.0)
MONOS PCT: 6.4 % (ref 3.0–12.0)
Monocytes Absolute: 0.4 10*3/uL (ref 0.1–1.0)
NEUTROS PCT: 51.2 % (ref 43.0–77.0)
Neutro Abs: 2.8 10*3/uL (ref 1.4–7.7)
Platelets: 351 10*3/uL (ref 150.0–400.0)
RBC: 4.51 Mil/uL (ref 3.87–5.11)
RDW: 12.8 % (ref 11.5–15.5)
WBC: 5.5 10*3/uL (ref 4.0–10.5)

## 2014-05-19 LAB — LIPID PANEL
CHOLESTEROL: 230 mg/dL — AB (ref 0–200)
HDL: 36.2 mg/dL — ABNORMAL LOW (ref >39.00)
NonHDL: 193.8
Total CHOL/HDL Ratio: 6
Triglycerides: 239 mg/dL — ABNORMAL HIGH (ref 0.0–149.0)
VLDL: 47.8 mg/dL — ABNORMAL HIGH (ref 0.0–40.0)

## 2014-05-19 LAB — LDL CHOLESTEROL, DIRECT: Direct LDL: 158.7 mg/dL

## 2014-05-20 ENCOUNTER — Encounter: Payer: Self-pay | Admitting: *Deleted

## 2014-05-20 ENCOUNTER — Other Ambulatory Visit: Payer: Self-pay | Admitting: Internal Medicine

## 2014-05-20 DIAGNOSIS — E876 Hypokalemia: Secondary | ICD-10-CM

## 2014-05-20 DIAGNOSIS — R739 Hyperglycemia, unspecified: Secondary | ICD-10-CM

## 2014-05-20 NOTE — Progress Notes (Signed)
Orders placed for f/u labs.  

## 2014-06-02 ENCOUNTER — Encounter: Payer: Self-pay | Admitting: Internal Medicine

## 2014-06-05 ENCOUNTER — Telehealth: Payer: Self-pay | Admitting: Internal Medicine

## 2014-06-05 NOTE — Telephone Encounter (Signed)
Form placed in red folder  

## 2014-06-05 NOTE — Telephone Encounter (Signed)
Will review and complete

## 2014-06-05 NOTE — Telephone Encounter (Signed)
Ms. Kareem dropped of FMLA paperwork to be filled out by Dr. Nicki Reaper. I placed in in her box. Thank you.

## 2014-06-09 ENCOUNTER — Other Ambulatory Visit: Payer: BC Managed Care – PPO

## 2014-06-09 ENCOUNTER — Encounter: Payer: Self-pay | Admitting: Internal Medicine

## 2014-06-09 ENCOUNTER — Ambulatory Visit (INDEPENDENT_AMBULATORY_CARE_PROVIDER_SITE_OTHER): Payer: BC Managed Care – PPO | Admitting: Internal Medicine

## 2014-06-09 DIAGNOSIS — E876 Hypokalemia: Secondary | ICD-10-CM

## 2014-06-09 DIAGNOSIS — R739 Hyperglycemia, unspecified: Secondary | ICD-10-CM

## 2014-06-09 LAB — POTASSIUM: POTASSIUM: 3.7 meq/L (ref 3.5–5.1)

## 2014-06-09 LAB — HEMOGLOBIN A1C: HEMOGLOBIN A1C: 5.5 % (ref 4.6–6.5)

## 2014-06-09 NOTE — Progress Notes (Signed)
Pre visit review using our clinic review tool, if applicable. No additional management support is needed unless otherwise documented below in the visit note. 

## 2014-06-09 NOTE — Progress Notes (Signed)
Subjective:    Patient ID: Ashley Waller, female    DOB: 07-Jun-1961, 52 y.o.   MRN: [id number]  HPI 53 year old female with past history of hypercholesterolemia who comes in today as a work in for [employer] form completion.   She states she is doing relatively well.  Has been under increased stress recently.  Father recently passed away.  Mother is now in [place].  Increased stress related to this.  She states that her mother "almost died" recently.  She feels she needs to be there for her mother and take care of her.  She has discussed this with her work.  They advised her to use her FMLA time.  She states this will allow her to still go in and complete things with pts if she needs to.  Plans to bring her mother to her house to live.  Husband is supportive.  Feels she is handling the stress relatively well.    Past Medical History  Diagnosis Date  . Hypertension   . Asthma   . Hematuria   . Arthritis   . Hemorrhoid     Current Outpatient Prescriptions on File Prior to Visit  Medication Sig Dispense Refill  . acetaminophen (TYLENOL) 325 MG tablet Take 650 mg by mouth every 6 (six) hours as needed for pain.    Marland Kitchen albuterol (PROVENTIL HFA;VENTOLIN HFA) 108 (90 BASE) MCG/ACT inhaler Inhale 2 puffs into the lungs every 6 (six) hours as needed. 6.7 g 5  . aspirin 81 MG tablet Take 81 mg by mouth daily.    . fluticasone (FLONASE) 50 MCG/ACT nasal spray TWO PUFFS IN EACH NOSTRIL ONCE A DAY 16 g 5  . hydrochlorothiazide (HYDRODIURIL) 25 MG tablet 1/2 tablet q day 90 tablet 3  . loratadine (CLARITIN) 10 MG tablet Take 10 mg by mouth daily.    . montelukast (SINGULAIR) 10 MG tablet Take 1 tablet (10 mg total) by mouth daily. 90 tablet 1  . sertraline (ZOLOFT) 100 MG tablet Take 1 tablet (100 mg total) by mouth daily. 90 tablet 1   No current facility-administered medications on file prior to visit.    Review of Systems  Handling stress relatively well. Overall she feels she is doing relatively  well.  Given her mother's health, she feels she needs to take care of her and be there for her.  She plans to bring her mother to her house.       Objective:   Physical Exam  Filed Vitals:   06/09/14 0800  BP: 110/70  Pulse: 68  Temp: 97.9 F (89.43 C)   53 year old female in no acute distress.   Exam not performed given the nature of this visit.             Assessment & Plan:  INCREASED PSYCHOSOCIAL STRESSORS.  On Zoloft.  Follow.  Feels she is handling things relatively well.    FORM COMPLETION.  Discussed her current work situation.  See above.  Her boss suggested she use her FMLA.  She may need to go back and forth to work for pt care.  Plans to bring her mother home to live with her.  Form completed.     I spent 15 minutes with the patient and more than 50% of the time was spent in consultation regarding the above.  Time was spent discussing her current work situation and her mother's condition.  Also time spent going over her plan of care for her mother.

## 2014-06-10 ENCOUNTER — Encounter: Payer: Self-pay | Admitting: *Deleted

## 2014-06-10 ENCOUNTER — Other Ambulatory Visit: Payer: BC Managed Care – PPO

## 2014-06-15 DIAGNOSIS — Z7689 Persons encountering health services in other specified circumstances: Secondary | ICD-10-CM

## 2014-06-23 NOTE — Telephone Encounter (Signed)
Forms received today & left voicemail to notify patient that forms are ready for pick up & placed up front.

## 2014-08-08 ENCOUNTER — Other Ambulatory Visit: Payer: Self-pay | Admitting: *Deleted

## 2014-08-08 MED ORDER — MONTELUKAST SODIUM 10 MG PO TABS: 10.0000 mg | ORAL_TABLET | Freq: Every day | ORAL | Status: DC

## 2014-11-24 ENCOUNTER — Ambulatory Visit (INDEPENDENT_AMBULATORY_CARE_PROVIDER_SITE_OTHER): Payer: 59 | Admitting: Internal Medicine

## 2014-11-24 ENCOUNTER — Encounter: Payer: Self-pay | Admitting: Internal Medicine

## 2014-11-24 ENCOUNTER — Other Ambulatory Visit (HOSPITAL_COMMUNITY)
Admission: RE | Admit: 2014-11-24 | Discharge: 2014-11-24 | Disposition: A | Discharge status: Home / Self Care | Payer: Commercial Managed Care - PPO | Source: Ambulatory Visit | Attending: Internal Medicine | Admitting: Internal Medicine

## 2014-11-24 VITALS — BP 94/61 | HR 58 | Temp 97.9°F | Ht 61.0 in | Wt 161.1 lb

## 2014-11-24 DIAGNOSIS — Z1239 Encounter for other screening for malignant neoplasm of breast: Secondary | ICD-10-CM

## 2014-11-24 DIAGNOSIS — E7800 Pure hypercholesterolemia, unspecified: Secondary | ICD-10-CM

## 2014-11-24 DIAGNOSIS — Z1151 Encounter for screening for human papillomavirus (HPV): Secondary | ICD-10-CM | POA: Insufficient documentation

## 2014-11-24 DIAGNOSIS — Z658 Other specified problems related to psychosocial circumstances: Secondary | ICD-10-CM

## 2014-11-24 DIAGNOSIS — Z01419 Encounter for gynecological examination (general) (routine) without abnormal findings: Secondary | ICD-10-CM | POA: Diagnosis not present

## 2014-11-24 DIAGNOSIS — R739 Hyperglycemia, unspecified: Secondary | ICD-10-CM

## 2014-11-24 DIAGNOSIS — F439 Reaction to severe stress, unspecified: Secondary | ICD-10-CM

## 2014-11-24 DIAGNOSIS — Z0000 Encounter for general adult medical examination without abnormal findings: Secondary | ICD-10-CM | POA: Diagnosis not present

## 2014-11-24 DIAGNOSIS — R928 Other abnormal and inconclusive findings on diagnostic imaging of breast: Secondary | ICD-10-CM

## 2014-11-24 DIAGNOSIS — I10 Essential (primary) hypertension: Secondary | ICD-10-CM | POA: Diagnosis not present

## 2014-11-24 DIAGNOSIS — E669 Obesity, unspecified: Secondary | ICD-10-CM

## 2014-11-24 NOTE — Progress Notes (Signed)
Patient ID: Ashley Waller, female   DOB: [birth year], 54 y.o.   MRN: [id number]   Subjective:    Patient ID: Ashley Waller, female    DOB: 1961-06-07, 54 y.o.   MRN: [id number]  HPI  Patient here for her physical exam.  She left her job to take care of her mother.  Her mother recently passed.  She has lost weight.  Eating better.  Eating regular meals.  No nausea or vomiting.  No bowel change.  Blood pressure low.     Past Medical History  Diagnosis Date  . Hypertension   . Asthma   . Hematuria   . Arthritis   . Hemorrhoid     Review of Systems  Constitutional: Negative for appetite change.       Has adjusted her diet.  Has lost weight.  Feels better.    HENT: Negative for congestion and sinus pressure.   Eyes: Negative for pain and visual disturbance.  Respiratory: Negative for cough, chest tightness and shortness of breath.   Cardiovascular: Negative for chest pain, palpitations and leg swelling.  Gastrointestinal: Negative for vomiting, abdominal pain and diarrhea.  Genitourinary: Negative for dysuria and difficulty urinating.  Musculoskeletal: Negative for back pain and joint swelling.  Skin: Negative for color change and rash.  Neurological: Negative for dizziness, light-headedness and headaches.  Hematological: Negative for adenopathy. Does not bruise/bleed easily.  Psychiatric/Behavioral: Negative for dysphoric mood and agitation.       Objective:    Physical Exam  Constitutional: She is oriented to person, place, and time. She appears well-developed and well-nourished.  HENT:  Nose: Nose normal.  Mouth/Throat: Oropharynx is clear and moist.  Eyes: Right eye exhibits no discharge. Left eye exhibits no discharge. No scleral icterus.  Neck: Neck supple. No thyromegaly present.  Cardiovascular: Normal rate and regular rhythm.   Pulmonary/Chest: Breath sounds normal. No accessory muscle usage. No tachypnea. No respiratory distress. She has no decreased breath sounds. She has  no wheezes. She has no rhonchi. Right breast exhibits no inverted nipple, no mass, no nipple discharge and no tenderness (no axillary adenopathy). Left breast exhibits no inverted nipple, no mass, no nipple discharge and no tenderness (no axilarry adenopathy).  Abdominal: Soft. Bowel sounds are normal. There is no tenderness.  Genitourinary:  Normal external genitalia.  Vaginal vault without lesions.  Cervix identified.  Pap smear performed.  Could not appreciate any adnexal masses or tenderness.    Musculoskeletal: She exhibits no edema or tenderness.  Lymphadenopathy:    She has no cervical adenopathy.  Neurological: She is alert and oriented to person, place, and time.  Skin: Skin is warm. No rash noted.  Psychiatric: She has a normal mood and affect. Her behavior is normal.    BP 94/61 mmHg  Pulse 58  Temp(Src) 97.9 F (36.6 C) (Oral)  Ht 5\' 1"  (1.549 m)  Wt 161 lb 2 oz (73.086 kg)  BMI 30.46 kg/m2  SpO2 97%  LMP 07/16/2009 Wt Readings from Last 3 Encounters:  11/24/14 161 lb 2 oz (73.086 kg)  06/09/14 186 lb (84.369 kg)  05/16/14 189 lb (85.73 kg)     Lab Results  Component Value Date   WBC 5.5 05/19/2014   HGB 13.6 05/19/2014   HCT 41.0 05/19/2014   PLT 351.0 05/19/2014   GLUCOSE 110* 05/19/2014   CHOL 230* 05/19/2014   TRIG 239.0* 05/19/2014   HDL 36.20* 05/19/2014   LDLDIRECT 158.7 05/19/2014   ALT 11 05/19/2014   AST 17  05/19/2014   NA 142 05/19/2014   K 3.7 06/09/2014   CL 102 05/19/2014   CREATININE 0.7 05/19/2014   BUN 9 05/19/2014   CO2 32 05/19/2014   TSH 2.22 05/19/2014   HGBA1C 5.5 06/09/2014       Assessment & Plan:   Problem List Items Addressed This Visit    Abnormal mammogram    Mammogram 10/21/13 - [facility].  Schedule f/u mammogram.       Health care maintenance    Physical 11/24/14.  Schedule mammogram.  PAP today.        Hypercholesterolemia    Low cholesterol diet and exercise.  Follow lipid panel.        Relevant Orders    Lipid panel   Hepatic function panel   Hypertension    Blood pressure running low.  Stop hctz.  Have her monitor her blood pressure.  Send in readings.  Follow metabolic panel.        Relevant Orders   Basic metabolic panel   Obesity (BMI 35.0-39.9 without comorbidity)    Diet and exercise.        Stress    Increased stress.  Doing better.  Follow.         Other Visit Diagnoses    Breast cancer screening    -  Primary    Relevant Orders    MM DIGITAL SCREENING BILATERAL    Cytology - PAP (Completed)    Hyperglycemia        Relevant Orders    Hemoglobin A1c      I spent 25 minutes with the patient and more than 50% of the time was spent in consultation regarding the above.     Einar Pheasant, MD

## 2014-11-24 NOTE — Patient Instructions (Signed)
Stop the hctz

## 2014-11-24 NOTE — Progress Notes (Signed)
Pre visit review using our clinic review tool, if applicable. No additional management support is needed unless otherwise documented below in the visit note. 

## 2014-11-25 LAB — CYTOLOGY - PAP

## 2014-11-26 ENCOUNTER — Encounter: Payer: Self-pay | Admitting: *Deleted

## 2014-11-30 DIAGNOSIS — Z0000 Encounter for general adult medical examination without abnormal findings: Secondary | ICD-10-CM

## 2014-11-30 HISTORY — DX: Encounter for general adult medical examination without abnormal findings: Z00.00

## 2014-11-30 NOTE — Assessment & Plan Note (Signed)
Mammogram 10/21/13 - [facility] I.  Schedule f/u mammogram.

## 2014-11-30 NOTE — Assessment & Plan Note (Signed)
Increased stress.  Doing better.  Follow.

## 2014-11-30 NOTE — Assessment & Plan Note (Signed)
Blood pressure running low.  Stop hctz.  Have her monitor her blood pressure.  Send in readings.  Follow metabolic panel.

## 2014-11-30 NOTE — Assessment & Plan Note (Signed)
Diet and exercise.   

## 2014-11-30 NOTE — Assessment & Plan Note (Signed)
Low cholesterol diet and exercise.  Follow lipid panel.   

## 2014-11-30 NOTE — Assessment & Plan Note (Signed)
Physical 11/24/14.  Schedule mammogram.  PAP today.

## 2014-12-10 ENCOUNTER — Telehealth: Payer: Self-pay | Admitting: Internal Medicine

## 2014-12-10 ENCOUNTER — Other Ambulatory Visit (INDEPENDENT_AMBULATORY_CARE_PROVIDER_SITE_OTHER): Payer: 59

## 2014-12-10 ENCOUNTER — Encounter: Payer: Self-pay | Admitting: *Deleted

## 2014-12-10 DIAGNOSIS — R739 Hyperglycemia, unspecified: Secondary | ICD-10-CM | POA: Diagnosis not present

## 2014-12-10 DIAGNOSIS — I10 Essential (primary) hypertension: Secondary | ICD-10-CM

## 2014-12-10 DIAGNOSIS — E7800 Pure hypercholesterolemia, unspecified: Secondary | ICD-10-CM

## 2014-12-10 LAB — HEPATIC FUNCTION PANEL
ALBUMIN: 3.9 g/dL (ref 3.5–5.2)
ALT: 12 U/L (ref 0–35)
AST: 15 U/L (ref 0–37)
Alkaline Phosphatase: 69 U/L (ref 39–117)
Bilirubin, Direct: 0.1 mg/dL (ref 0.0–0.3)
TOTAL PROTEIN: 6.6 g/dL (ref 6.0–8.3)
Total Bilirubin: 0.4 mg/dL (ref 0.2–1.2)

## 2014-12-10 LAB — LIPID PANEL
CHOL/HDL RATIO: 4
CHOLESTEROL: 182 mg/dL (ref 0–200)
HDL: 45.3 mg/dL (ref >39.00)
LDL Cholesterol: 114 mg/dL — ABNORMAL HIGH (ref 0–99)
NonHDL: 136.7
TRIGLYCERIDES: 115 mg/dL (ref 0.0–149.0)
VLDL: 23 mg/dL (ref 0.0–40.0)

## 2014-12-10 LAB — BASIC METABOLIC PANEL
BUN: 8 mg/dL (ref 6–23)
CALCIUM: 9.4 mg/dL (ref 8.4–10.5)
CO2: 33 mEq/L — ABNORMAL HIGH (ref 19–32)
Chloride: 105 mEq/L (ref 96–112)
Creatinine, Ser: 0.6 mg/dL (ref 0.40–1.20)
GFR: 109.8 mL/min (ref >60.00)
GLUCOSE: 91 mg/dL (ref 70–99)
Potassium: 4 mEq/L (ref 3.5–5.1)
Sodium: 141 mEq/L (ref 135–145)

## 2014-12-10 LAB — HEMOGLOBIN A1C: Hgb A1c MFr Bld: 5.5 % (ref 4.6–6.5)

## 2014-12-10 NOTE — Telephone Encounter (Signed)
Reviewed blood pressures.  Overall ok.  Continue to monitor.

## 2014-12-10 NOTE — Telephone Encounter (Signed)
Pt dropped off BP readings. List in Dr. Bary Leriche box/msn

## 2014-12-10 NOTE — Telephone Encounter (Signed)
Left message on voicemail.

## 2014-12-10 NOTE — Telephone Encounter (Signed)
Placed in yellow folder.  

## 2014-12-16 ENCOUNTER — Ambulatory Visit
Admission: RE | Admit: 2014-12-16 | Discharge: 2014-12-16 | Disposition: A | Discharge status: Home / Self Care | Payer: 59 | Source: Ambulatory Visit | Attending: Internal Medicine | Admitting: Internal Medicine

## 2014-12-16 DIAGNOSIS — Z1231 Encounter for screening mammogram for malignant neoplasm of breast: Secondary | ICD-10-CM | POA: Insufficient documentation

## 2014-12-16 DIAGNOSIS — Z1239 Encounter for other screening for malignant neoplasm of breast: Secondary | ICD-10-CM

## 2015-01-13 ENCOUNTER — Other Ambulatory Visit: Payer: Self-pay | Admitting: Internal Medicine

## 2015-02-04 ENCOUNTER — Other Ambulatory Visit: Payer: Self-pay | Admitting: Internal Medicine

## 2015-02-24 ENCOUNTER — Ambulatory Visit (INDEPENDENT_AMBULATORY_CARE_PROVIDER_SITE_OTHER): Payer: 59 | Admitting: Internal Medicine

## 2015-02-24 ENCOUNTER — Encounter: Payer: Self-pay | Admitting: Internal Medicine

## 2015-02-24 VITALS — BP 100/70 | HR 56 | Temp 98.0°F | Ht 61.0 in | Wt 155.2 lb

## 2015-02-24 DIAGNOSIS — R928 Other abnormal and inconclusive findings on diagnostic imaging of breast: Secondary | ICD-10-CM | POA: Diagnosis not present

## 2015-02-24 DIAGNOSIS — I10 Essential (primary) hypertension: Secondary | ICD-10-CM

## 2015-02-24 DIAGNOSIS — Z0000 Encounter for general adult medical examination without abnormal findings: Secondary | ICD-10-CM

## 2015-02-24 DIAGNOSIS — F439 Reaction to severe stress, unspecified: Secondary | ICD-10-CM

## 2015-02-24 DIAGNOSIS — E7800 Pure hypercholesterolemia, unspecified: Secondary | ICD-10-CM

## 2015-02-24 DIAGNOSIS — Z860100 Personal history of colon polyps, unspecified: Secondary | ICD-10-CM

## 2015-02-24 DIAGNOSIS — E669 Obesity, unspecified: Secondary | ICD-10-CM

## 2015-02-24 DIAGNOSIS — Z658 Other specified problems related to psychosocial circumstances: Secondary | ICD-10-CM

## 2015-02-24 NOTE — Progress Notes (Signed)
Patient ID: Ashley Waller, female   DOB: July 31, 1961, 54 y.o.   MRN: [id number]   Subjective:    Patient ID: Ashley Waller, female    DOB: 1961/07/18, 54 y.o.   MRN: [id number]  HPI  Patient here for a scheduled follow up.  She is enjoying retirement.  Trying to stay active.  Has lost weight.  Feels better.  No cardiac symptoms with increased activity or exertion.  No sob.  Bowels stable.  Blood pressure on average 120-130/60-80.  Off hctz.    Past Medical History  Diagnosis Date  . Hypertension   . Asthma   . Hematuria   . Arthritis   . Hemorrhoid     Outpatient Encounter Prescriptions as of 02/24/2015  Medication Sig  . acetaminophen (TYLENOL) 325 MG tablet Take 650 mg by mouth every 6 (six) hours as needed for pain.  Marland Kitchen albuterol (PROVENTIL HFA;VENTOLIN HFA) 108 (90 BASE) MCG/ACT inhaler Inhale 2 puffs into the lungs every 6 (six) hours as needed.  Marland Kitchen aspirin 81 MG tablet Take 81 mg by mouth daily.  . fluticasone (FLONASE) 50 MCG/ACT nasal spray TWO PUFFS IN EACH NOSTRIL ONCE A DAY  . loratadine (CLARITIN) 10 MG tablet Take 10 mg by mouth daily.  . montelukast (SINGULAIR) 10 MG tablet TAKE ONE (1) TABLET EACH DAY  . sertraline (ZOLOFT) 100 MG tablet TAKE ONE (1) TABLET EACH DAY  . [DISCONTINUED] hydrochlorothiazide (HYDRODIURIL) 25 MG tablet 1/2 tablet q day   No facility-administered encounter medications on file as of 02/24/2015.    Review of Systems  Constitutional: Negative for appetite change and unexpected weight change.  HENT: Negative for congestion and sinus pressure.   Respiratory: Negative for cough, chest tightness and shortness of breath.   Cardiovascular: Negative for chest pain, palpitations and leg swelling.  Gastrointestinal: Negative for nausea, vomiting, abdominal pain and diarrhea.  Skin: Negative for color change and rash.  Neurological: Negative for dizziness, light-headedness and headaches.  Psychiatric/Behavioral: Negative for dysphoric mood and agitation.        Objective:     Blood pressure recheck:  118/68  Physical Exam  Constitutional: She appears well-developed and well-nourished. No distress.  HENT:  Nose: Nose normal.  Mouth/Throat: Oropharynx is clear and moist.  Neck: Neck supple. No thyromegaly present.  Cardiovascular: Normal rate and regular rhythm.   Pulmonary/Chest: Breath sounds normal. No respiratory distress. She has no wheezes.  Abdominal: Soft. Bowel sounds are normal. There is no tenderness.  Musculoskeletal: She exhibits no edema or tenderness.  Lymphadenopathy:    She has no cervical adenopathy.  Skin: No rash noted. No erythema.  Psychiatric: She has a normal mood and affect. Her behavior is normal.    BP 100/70 mmHg  Pulse 56  Temp(Src) 98 F (36.7 C) (Oral)  Ht 5\' 1"  (1.549 m)  Wt 155 lb 4 oz (70.421 kg)  BMI 29.35 kg/m2  SpO2 96%  LMP 07/16/2009 Wt Readings from Last 3 Encounters:  02/24/15 155 lb 4 oz (70.421 kg)  11/24/14 161 lb 2 oz (73.086 kg)  06/09/14 186 lb (84.369 kg)     Lab Results  Component Value Date   WBC 5.5 05/19/2014   HGB 13.6 05/19/2014   HCT 41.0 05/19/2014   PLT 351.0 05/19/2014   GLUCOSE 91 12/10/2014   CHOL 182 12/10/2014   TRIG 115.0 12/10/2014   HDL 45.30 12/10/2014   LDLDIRECT 158.7 05/19/2014   LDLCALC 114* 12/10/2014   ALT 12 12/10/2014   AST 15 12/10/2014  NA 141 12/10/2014   K 4.0 12/10/2014   CL 105 12/10/2014   CREATININE 0.60 12/10/2014   BUN 8 12/10/2014   CO2 33* 12/10/2014   TSH 2.22 05/19/2014   HGBA1C 5.5 12/10/2014    Mm Digital Screening Bilateral  12/16/2014   CLINICAL DATA:  Screening.  EXAM: DIGITAL SCREENING BILATERAL MAMMOGRAM WITH CAD  COMPARISON:  Previous exam(s).  ACR Breast Density Category b: There are scattered areas of fibroglandular density.  FINDINGS: There are no findings suspicious for malignancy. Images were processed with CAD.  IMPRESSION: No mammographic evidence of malignancy. A result letter of this screening  mammogram will be mailed directly to the patient.  RECOMMENDATION: Screening mammogram in one year. (Code:[id number]-B-01Y)  BI-RADS CATEGORY  1: Negative.   Electronically Signed   By: Lajean Manes M.D.   On: 12/16/2014 11:14       Assessment & Plan:   Problem List Items Addressed This Visit    Abnormal mammogram    Mammogram 12/16/14 - birads I.        Health care maintenance    Physical 11/24/14.  Mammogram 12/16/14 - Bir[facility] I.  PAP 11/23/13 - negative with negative HPV.  Colonoscopy 12/22/10.  Recommended f/u colonoscopy in 2017.       History of colonic polyps    Colonoscopy 2012 - cecal polyp.  Recommended f/u colonoscopy in 2017.      Hypercholesterolemia    Low cholesterol diet and exercise.  Follow lipid panel.  Has adjusted her diet.  Lost weight.       Hypertension - Primary    Blood pressure doing well off hctz.  Follow.       Obesity (BMI 35.0-39.9 without comorbidity)    Continue diet and exercise.       Stress    Doing better.  On zoloft.  Follow.           Einar Pheasant, MD

## 2015-02-24 NOTE — Progress Notes (Signed)
Pre visit review using our clinic review tool, if applicable. No additional management support is needed unless otherwise documented below in the visit note. 

## 2015-02-27 ENCOUNTER — Encounter: Payer: Self-pay | Admitting: Internal Medicine

## 2015-02-27 DIAGNOSIS — Z860100 Personal history of colon polyps, unspecified: Secondary | ICD-10-CM

## 2015-02-27 HISTORY — DX: Personal history of colon polyps, unspecified: Z86.0100

## 2015-02-27 HISTORY — DX: Personal history of colonic polyps: Z86.010

## 2015-02-27 NOTE — Assessment & Plan Note (Signed)
Low cholesterol diet and exercise.  Follow lipid panel.  Has adjusted her diet.  Lost weight.

## 2015-02-27 NOTE — Assessment & Plan Note (Signed)
Physical 11/24/14.  Mammogram 12/16/14 - [facility] I.  PAP 11/23/13 - negative with negative HPV.  Colonoscopy 12/22/10.  Recommended f/u colonoscopy in 2017.

## 2015-02-27 NOTE — Assessment & Plan Note (Signed)
Colonoscopy 2012 - cecal polyp.  Recommended f/u colonoscopy in 2017.

## 2015-02-27 NOTE — Assessment & Plan Note (Signed)
Mammogram 12/16/14 - birads I.

## 2015-02-27 NOTE — Assessment & Plan Note (Signed)
Continue diet and exercise. 

## 2015-02-27 NOTE — Assessment & Plan Note (Signed)
Blood pressure doing well off hctz.  Follow.

## 2015-02-27 NOTE — Assessment & Plan Note (Signed)
Doing better.  On zoloft.  Follow.

## 2015-06-29 ENCOUNTER — Ambulatory Visit (INDEPENDENT_AMBULATORY_CARE_PROVIDER_SITE_OTHER): Payer: 59 | Admitting: Internal Medicine

## 2015-06-29 ENCOUNTER — Encounter: Payer: Self-pay | Admitting: Internal Medicine

## 2015-06-29 VITALS — BP 102/70 | HR 71 | Temp 98.0°F | Resp 18 | Ht 61.0 in | Wt 150.1 lb

## 2015-06-29 DIAGNOSIS — Z86010 Personal history of colonic polyps: Secondary | ICD-10-CM

## 2015-06-29 DIAGNOSIS — E7800 Pure hypercholesterolemia, unspecified: Secondary | ICD-10-CM | POA: Diagnosis not present

## 2015-06-29 DIAGNOSIS — Z23 Encounter for immunization: Secondary | ICD-10-CM | POA: Diagnosis not present

## 2015-06-29 DIAGNOSIS — Z658 Other specified problems related to psychosocial circumstances: Secondary | ICD-10-CM

## 2015-06-29 DIAGNOSIS — R928 Other abnormal and inconclusive findings on diagnostic imaging of breast: Secondary | ICD-10-CM | POA: Diagnosis not present

## 2015-06-29 DIAGNOSIS — I10 Essential (primary) hypertension: Secondary | ICD-10-CM | POA: Diagnosis not present

## 2015-06-29 DIAGNOSIS — F439 Reaction to severe stress, unspecified: Secondary | ICD-10-CM

## 2015-06-29 MED ORDER — ALBUTEROL SULFATE HFA 108 (90 BASE) MCG/ACT IN AERS: 2.0000 | INHALATION_SPRAY | Freq: Four times a day (QID) | RESPIRATORY_TRACT | Status: DC | PRN

## 2015-06-29 NOTE — Assessment & Plan Note (Signed)
Blood pressure doing well.  Off hctz.  Follow pressures.

## 2015-06-29 NOTE — Progress Notes (Signed)
Pre-visit discussion using our clinic review tool. No additional management support is needed unless otherwise documented below in the visit note.  

## 2015-06-29 NOTE — Assessment & Plan Note (Signed)
Has adjusted her diet.  Lost weight.  Check lipid panel.  

## 2015-06-29 NOTE — Patient Instructions (Signed)

## 2015-06-29 NOTE — Progress Notes (Signed)
Patient ID: Ashley Waller, female   DOB: 08-06-1960, 54 y.o.   MRN: [id number]   Subjective:    Patient ID: Ashley Waller, female    DOB: Jan 02, 1961, 54 y.o.   MRN: [id number]  HPI  Patient with past history of hypertension, stress and hypercholesterolemia who comes in today to follow up on these issues.  Some increased stress and some mild depression.  Her brother is going back to [place].  Dealing with her mother's death.  She is getting out.  Eating well.  Has adjusted her diet.  Tries to stay busy.  No cardiac symptoms with increased activity or exertion.  No sob.  No abdominal pain or cramping.  Bowels stable.  She has been off zoloft since her last visit.  We discussed restarting through this transition time.  She is going to start looking for a new job.     Past Medical History  Diagnosis Date  . Hypertension   . Asthma   . Hematuria   . Arthritis   . Hemorrhoid    Past Surgical History  Procedure Laterality Date  . Tubal ligation  1995  . Colonoscopy  2013  . Eyelid laceration repair  2006   Family History  Problem Relation Age of Onset  . Hyperlipidemia Mother   . Diabetes Mother   . Hypertension Mother   . Hyperlipidemia Father   . Diabetes Father   . Hypertension Father   . Mental illness Maternal Grandmother   . Mental illness Maternal Grandfather   . Mental illness Paternal Grandmother   . Mental illness Paternal Grandfather    Social History   Social History  . Marital Status: Married    Spouse Name: N/A  . Number of Children: N/A  . Years of Education: N/A   Social History Main Topics  . Smoking status: Never Smoker   . Smokeless tobacco: Never Used  . Alcohol Use: 1.2 oz/week    2 Standard drinks or equivalent per week     Comment: ocassionally  . Drug Use: No  . Sexual Activity: Not Asked   Other Topics Concern  . None   Social History Narrative    Outpatient Encounter Prescriptions as of 06/29/2015  Medication Sig  . acetaminophen (TYLENOL) 325  MG tablet Take 650 mg by mouth every 6 (six) hours as needed for pain.  Marland Kitchen albuterol (PROVENTIL HFA;VENTOLIN HFA) 108 (90 BASE) MCG/ACT inhaler Inhale 2 puffs into the lungs every 6 (six) hours as needed.  Marland Kitchen aspirin 81 MG tablet Take 81 mg by mouth daily.  . fluticasone (FLONASE) 50 MCG/ACT nasal spray TWO PUFFS IN EACH NOSTRIL ONCE A DAY  . loratadine (CLARITIN) 10 MG tablet Take 10 mg by mouth daily.  . montelukast (SINGULAIR) 10 MG tablet TAKE ONE (1) TABLET EACH DAY  . [DISCONTINUED] albuterol (PROVENTIL HFA;VENTOLIN HFA) 108 (90 BASE) MCG/ACT inhaler Inhale 2 puffs into the lungs every 6 (six) hours as needed.  . [DISCONTINUED] sertraline (ZOLOFT) 100 MG tablet TAKE ONE (1) TABLET EACH DAY (Patient not taking: Reported on 06/29/2015)   No facility-administered encounter medications on file as of 06/29/2015.    Review of Systems  Constitutional: Negative for appetite change.       Has adjusted her diet.  Lost weight.    HENT: Negative for congestion and sinus pressure.   Eyes: Negative for discharge and redness.  Respiratory: Negative for cough, chest tightness and shortness of breath.   Cardiovascular: Negative for chest pain, palpitations and leg swelling.  Gastrointestinal: Negative for nausea, vomiting, abdominal pain and diarrhea.  Genitourinary: Negative for dysuria and difficulty urinating.  Musculoskeletal: Negative for back pain and joint swelling.  Skin: Negative for color change and rash.  Neurological: Negative for dizziness, light-headedness and headaches.  Psychiatric/Behavioral: Negative for dysphoric mood and agitation.       Objective:     Blood pressure rechecked by me:  116/72  Physical Exam  Constitutional: She appears well-developed and well-nourished. No distress.  HENT:  Nose: Nose normal.  Mouth/Throat: Oropharynx is clear and moist.  Eyes: Conjunctivae are normal. Right eye exhibits no discharge. Left eye exhibits no discharge.  Neck: Neck supple. No  thyromegaly present.  Cardiovascular: Normal rate and regular rhythm.   Pulmonary/Chest: Breath sounds normal. No respiratory distress. She has no wheezes.  Abdominal: Soft. Bowel sounds are normal. There is no tenderness.  Musculoskeletal: She exhibits no edema or tenderness.  Lymphadenopathy:    She has no cervical adenopathy.  Skin: No rash noted. No erythema.  Psychiatric: She has a normal mood and affect. Her behavior is normal.    BP 102/70 mmHg  Pulse 71  Temp(Src) 98 F (36.7 C) (Oral)  Resp 18  Ht 5\' 1"  (1.549 m)  Wt 150 lb 2 oz (68.096 kg)  BMI 28.38 kg/m2  SpO2 96%  LMP 07/16/2009 Wt Readings from Last 3 Encounters:  06/29/15 150 lb 2 oz (68.096 kg)  02/24/15 155 lb 4 oz (70.421 kg)  11/24/14 161 lb 2 oz (73.086 kg)     Lab Results  Component Value Date   WBC 5.6 06/29/2015   HGB 13.6 05/19/2014   HCT 41.1 06/29/2015   PLT 351.0 05/19/2014   GLUCOSE 87 06/29/2015   CHOL 205* 06/29/2015   TRIG 159* 06/29/2015   HDL 52 06/29/2015   LDLDIRECT 158.7 05/19/2014   LDLCALC 121* 06/29/2015   ALT 9 06/29/2015   AST 11 06/29/2015   NA 142 06/29/2015   K 4.9 06/29/2015   CL 101 06/29/2015   CREATININE 0.57 06/29/2015   BUN 9 06/29/2015   CO2 26 06/29/2015   TSH 3.950 06/29/2015   HGBA1C 5.5 12/10/2014    Mm Digital Screening Bilateral  12/16/2014  CLINICAL DATA:  Screening. EXAM: DIGITAL SCREENING BILATERAL MAMMOGRAM WITH CAD COMPARISON:  Previous exam(s). ACR Breast Density Category b: There are scattered areas of fibroglandular density. FINDINGS: There are no findings suspicious for malignancy. Images were processed with CAD. IMPRESSION: No mammographic evidence of malignancy. A result letter of this screening mammogram will be mailed directly to the patient. RECOMMENDATION: Screening mammogram in one year. (Code:SM-B-01Y) BI-RADS CATEGORY  1: Negative. Electronically Signed   By: Lajean Manes M.D.   On: 12/16/2014 11:14       Assessment & Plan:   Problem  List Items Addressed This Visit    Abnormal mammogram    Mammogram 12/16/14 - birads I.       History of colonic polyps    Colonoscopy 2012 - cecal polyp.  Recommended f/u colonoscopy in 2017.        Hypercholesterolemia    Has adjusted her diet.  Lost weight.  Check lipid panel.        Relevant Orders   Basic Metabolic Panel (BMET) (Completed)   CBC with Differential/Platelet (Completed)   Hepatic function panel (Completed)   Lipid panel (Completed)   TSH (Completed)   Hypertension    Blood pressure doing well.  Off hctz.  Follow pressures.  Relevant Orders   Basic Metabolic Panel (BMET) (Completed)   CBC with Differential/Platelet (Completed)   Hepatic function panel (Completed)   Lipid panel (Completed)   TSH (Completed)   Stress    Increased stress as outlined.  Discussed with her today.  Will restart zoloft 50mg  q day.  Follow.  Get her back in soon to reassess.        Other Visit Diagnoses    Encounter for immunization    -  Primary        Einar Pheasant, MD

## 2015-06-30 LAB — CBC WITH DIFFERENTIAL/PLATELET
Basophils Absolute: 0 10*3/uL (ref 0.0–0.2)
Basos: 1 %
EOS (ABSOLUTE): 0.1 10*3/uL (ref 0.0–0.4)
Eos: 2 %
Hematocrit: 41.1 % (ref 34.0–46.6)
Hemoglobin: 13.9 g/dL (ref 11.1–15.9)
Immature Grans (Abs): 0 10*3/uL (ref 0.0–0.1)
Immature Granulocytes: 0 %
Lymphocytes Absolute: 2.5 10*3/uL (ref 0.7–3.1)
Lymphs: 46 %
MCH: 30.4 pg (ref 26.6–33.0)
MCHC: 33.8 g/dL (ref 31.5–35.7)
MCV: 90 fL (ref 79–97)
Monocytes Absolute: 0.5 10*3/uL (ref 0.1–0.9)
Monocytes: 9 %
Neutrophils Absolute: 2.3 10*3/uL (ref 1.4–7.0)
Neutrophils: 42 %
Platelets: 352 10*3/uL (ref 150–379)
RBC: 4.57 x10E6/uL (ref 3.77–5.28)
RDW: 13.4 % (ref 12.3–15.4)
WBC: 5.6 10*3/uL (ref 3.4–10.8)

## 2015-06-30 LAB — HEPATIC FUNCTION PANEL
ALT: 9 IU/L (ref 0–32)
AST: 11 IU/L (ref 0–40)
Albumin: 4.4 g/dL (ref 3.5–5.5)
Alkaline Phosphatase: 79 IU/L (ref 39–117)
Bilirubin Total: 0.3 mg/dL (ref 0.0–1.2)
Bilirubin, Direct: 0.08 mg/dL (ref 0.00–0.40)
Total Protein: 6.4 g/dL (ref 6.0–8.5)

## 2015-06-30 LAB — LIPID PANEL
Chol/HDL Ratio: 3.9 ratio units (ref 0.0–4.4)
Cholesterol, Total: 205 mg/dL — ABNORMAL HIGH (ref 100–199)
HDL: 52 mg/dL (ref >39)
LDL Calculated: 121 mg/dL — ABNORMAL HIGH (ref 0–99)
Triglycerides: 159 mg/dL — ABNORMAL HIGH (ref 0–149)
VLDL Cholesterol Cal: 32 mg/dL (ref 5–40)

## 2015-06-30 LAB — BASIC METABOLIC PANEL
BUN/Creatinine Ratio: 16 (ref 9–23)
BUN: 9 mg/dL (ref 6–24)
CO2: 26 mmol/L (ref 18–29)
Calcium: 9.7 mg/dL (ref 8.7–10.2)
Chloride: 101 mmol/L (ref 97–106)
Creatinine, Ser: 0.57 mg/dL (ref 0.57–1.00)
GFR calc Af Amer: 120 mL/min/{1.73_m2} (ref >59)
GFR calc non Af Amer: 104 mL/min/{1.73_m2} (ref >59)
Glucose: 87 mg/dL (ref 65–99)
Potassium: 4.9 mmol/L (ref 3.5–5.2)
Sodium: 142 mmol/L (ref 136–144)

## 2015-06-30 LAB — TSH: TSH: 3.95 u[IU]/mL (ref 0.450–4.500)

## 2015-07-01 ENCOUNTER — Encounter: Payer: Self-pay | Admitting: *Deleted

## 2015-07-05 ENCOUNTER — Encounter: Payer: Self-pay | Admitting: Internal Medicine

## 2015-07-05 NOTE — Assessment & Plan Note (Signed)
Colonoscopy 2012 - cecal polyp.  Recommended f/u colonoscopy in 2017. 

## 2015-07-05 NOTE — Assessment & Plan Note (Signed)
Mammogram 12/16/14 - birads I.   

## 2015-07-05 NOTE — Assessment & Plan Note (Signed)
Increased stress as outlined.  Discussed with her today.  Will restart zoloft 50mg  q day.  Follow.  Get her back in soon to reassess.

## 2015-09-01 ENCOUNTER — Ambulatory Visit (INDEPENDENT_AMBULATORY_CARE_PROVIDER_SITE_OTHER): Payer: 59 | Admitting: Internal Medicine

## 2015-09-01 ENCOUNTER — Encounter: Payer: Self-pay | Admitting: Internal Medicine

## 2015-09-01 ENCOUNTER — Ambulatory Visit: Payer: 59 | Admitting: Internal Medicine

## 2015-09-01 VITALS — BP 120/80 | HR 78 | Temp 98.5°F | Resp 18 | Ht 61.0 in | Wt 153.4 lb

## 2015-09-01 DIAGNOSIS — F439 Reaction to severe stress, unspecified: Secondary | ICD-10-CM

## 2015-09-01 DIAGNOSIS — I10 Essential (primary) hypertension: Secondary | ICD-10-CM | POA: Diagnosis not present

## 2015-09-01 DIAGNOSIS — R928 Other abnormal and inconclusive findings on diagnostic imaging of breast: Secondary | ICD-10-CM

## 2015-09-01 DIAGNOSIS — Z86010 Personal history of colonic polyps: Secondary | ICD-10-CM | POA: Diagnosis not present

## 2015-09-01 DIAGNOSIS — E7800 Pure hypercholesterolemia, unspecified: Secondary | ICD-10-CM

## 2015-09-01 DIAGNOSIS — Z658 Other specified problems related to psychosocial circumstances: Secondary | ICD-10-CM

## 2015-09-01 MED ORDER — SERTRALINE HCL 50 MG PO TABS: 50.0000 mg | ORAL_TABLET | Freq: Every day | ORAL | Status: DC

## 2015-09-01 MED ORDER — MONTELUKAST SODIUM 10 MG PO TABS: ORAL_TABLET | ORAL | Status: DC

## 2015-09-01 MED ORDER — FLUTICASONE PROPIONATE 50 MCG/ACT NA SUSP: 2.0000 | Freq: Every day | NASAL | Status: DC

## 2015-09-01 NOTE — Progress Notes (Signed)
Pre-visit discussion using our clinic review tool. No additional management support is needed unless otherwise documented below in the visit note.  

## 2015-09-01 NOTE — Progress Notes (Signed)
Patient ID: Ashley Waller, female   DOB: May 15, 1961, 55 y.o.   MRN: [id number]   Subjective:    Patient ID: Ashley Waller, female    DOB: [birth year], 55 y.o.   MRN: [id number]  HPI  Patient with past history of hypercholesterolemia, stress and hypertension.  She comes in today to follow up on these issues.  She reports she is doing relatively well.  Looking for a job.  Some increased stress related to this.  Overall feels she handling things relatively well.  Does not feel needs any further intervention.  On zoloft.  Feels this is working well.  No chest pain or tightness.  No sob.  No acid reflux.  No abdominal pain or cramping.  Bowels stable.     Past Medical History  Diagnosis Date  . Hypertension   . Asthma   . Hematuria   . Arthritis   . Hemorrhoid    Past Surgical History  Procedure Laterality Date  . Tubal ligation  1995  . Colonoscopy  2013  . Eyelid laceration repair  2006   Family History  Problem Relation Age of Onset  . Hyperlipidemia Mother   . Diabetes Mother   . Hypertension Mother   . Hyperlipidemia Father   . Diabetes Father   . Hypertension Father   . Mental illness Maternal Grandmother   . Mental illness Maternal Grandfather   . Mental illness Paternal Grandmother   . Mental illness Paternal Grandfather    Social History   Social History  . Marital Status: Married    Spouse Name: N/A  . Number of Children: N/A  . Years of Education: N/A   Social History Main Topics  . Smoking status: Never Smoker   . Smokeless tobacco: Never Used  . Alcohol Use: 1.2 oz/week    2 Standard drinks or equivalent per week     Comment: ocassionally  . Drug Use: No  . Sexual Activity: Not Asked   Other Topics Concern  . None   Social History Narrative    Outpatient Encounter Prescriptions as of 09/01/2015  Medication Sig  . acetaminophen (TYLENOL) 325 MG tablet Take 650 mg by mouth every 6 (six) hours as needed for pain.  Marland Kitchen albuterol (PROVENTIL HFA;VENTOLIN HFA)  108 (90 BASE) MCG/ACT inhaler Inhale 2 puffs into the lungs every 6 (six) hours as needed.  Marland Kitchen aspirin 81 MG tablet Take 81 mg by mouth daily.  . fluticasone (FLONASE) 50 MCG/ACT nasal spray Place 2 sprays into both nostrils daily. Hold until pt request  . loratadine (CLARITIN) 10 MG tablet Take 10 mg by mouth daily.  . montelukast (SINGULAIR) 10 MG tablet TAKE ONE (1) TABLET EACH DAY  . [DISCONTINUED] fluticasone (FLONASE) 50 MCG/ACT nasal spray TWO PUFFS IN EACH NOSTRIL ONCE A DAY  . [DISCONTINUED] montelukast (SINGULAIR) 10 MG tablet TAKE ONE (1) TABLET EACH DAY  . sertraline (ZOLOFT) 50 MG tablet Take 1 tablet (50 mg total) by mouth daily.   No facility-administered encounter medications on file as of 09/01/2015.    Review of Systems  Constitutional: Negative for appetite change and unexpected weight change.  HENT: Negative for congestion and sinus pressure.   Respiratory: Negative for cough, chest tightness and shortness of breath.   Cardiovascular: Negative for chest pain, palpitations and leg swelling.  Gastrointestinal: Negative for nausea, vomiting, abdominal pain and diarrhea.  Genitourinary: Negative for dysuria and difficulty urinating.  Musculoskeletal: Negative for back pain and joint swelling.  Skin: Negative for color change and  rash.  Neurological: Negative for dizziness, light-headedness and headaches.  Psychiatric/Behavioral: Negative for dysphoric mood and agitation.       Objective:    Physical Exam  Constitutional: She appears well-developed and well-nourished. No distress.  HENT:  Nose: Nose normal.  Mouth/Throat: Oropharynx is clear and moist.  Eyes: Conjunctivae are normal. Right eye exhibits no discharge. Left eye exhibits no discharge.  Neck: Neck supple. No thyromegaly present.  Cardiovascular: Normal rate and regular rhythm.   Pulmonary/Chest: Breath sounds normal. No respiratory distress. She has no wheezes.  Abdominal: Soft. Bowel sounds are normal.  There is no tenderness.  Musculoskeletal: She exhibits no edema or tenderness.  Lymphadenopathy:    She has no cervical adenopathy.  Skin: No rash noted. No erythema.  Psychiatric: She has a normal mood and affect. Her behavior is normal.    BP 120/80 mmHg  Pulse 78  Temp(Src) 98.5 F (36.9 C) (Oral)  Resp 18  Ht 5\' 1"  (1.549 m)  Wt 153 lb 6 oz (69.57 kg)  BMI 28.99 kg/m2  SpO2 97%  LMP 07/16/2009 Wt Readings from Last 3 Encounters:  09/01/15 153 lb 6 oz (69.57 kg)  06/29/15 150 lb 2 oz (68.096 kg)  02/24/15 155 lb 4 oz (70.421 kg)     Lab Results  Component Value Date   WBC 5.6 06/29/2015   HGB 13.6 05/19/2014   HCT 41.1 06/29/2015   PLT 352 06/29/2015   GLUCOSE 87 06/29/2015   CHOL 205* 06/29/2015   TRIG 159* 06/29/2015   HDL 52 06/29/2015   LDLDIRECT 158.7 05/19/2014   LDLCALC 121* 06/29/2015   ALT 9 06/29/2015   AST 11 06/29/2015   NA 142 06/29/2015   K 4.9 06/29/2015   CL 101 06/29/2015   CREATININE 0.57 06/29/2015   BUN 9 06/29/2015   CO2 26 06/29/2015   TSH 3.950 06/29/2015   HGBA1C 5.5 12/10/2014    Mm Digital Screening Bilateral  12/16/2014  CLINICAL DATA:  Screening. EXAM: DIGITAL SCREENING BILATERAL MAMMOGRAM WITH CAD COMPARISON:  Previous exam(s). ACR Breast Density Category b: There are scattered areas of fibroglandular density. FINDINGS: There are no findings suspicious for malignancy. Images were processed with CAD. IMPRESSION: No mammographic evidence of malignancy. A result letter of this screening mammogram will be mailed directly to the patient. RECOMMENDATION: Screening mammogram in one year. (Code:SM-B-01Y) BI-RADS CATEGORY  1: Negative. Electronically Signed   By: Lajean Manes M.D.   On: 12/16/2014 11:14       Assessment & Plan:   Problem List Items Addressed This Visit    Abnormal mammogram    Mammogram 12/16/14 - Birads I.        History of colonic polyps    Colonoscopy 2012 - cecal polyp.  Recommended f/u colonoscopy in 2017.          Hypercholesterolemia    Low cholesterol diet and exercise.  Follow lipid panel.        Hypertension - Primary    Blood pressure under good control.  Continue same medication regimen.  Follow pressures.  Follow metabolic panel.        Stress    Increased stress.  On zoloft.  Doing better.  Feeling better.  Does not feel needs anything more at this point.  Follow.            Einar Pheasant, MD

## 2015-09-06 ENCOUNTER — Encounter: Payer: Self-pay | Admitting: Internal Medicine

## 2015-09-06 NOTE — Assessment & Plan Note (Signed)
Colonoscopy 2012 - cecal polyp.  Recommended f/u colonoscopy in 2017. 

## 2015-09-06 NOTE — Assessment & Plan Note (Signed)
Mammogram 12/16/14 - Birads I.

## 2015-09-06 NOTE — Assessment & Plan Note (Signed)
Increased stress.  On zoloft.  Doing better.  Feeling better.  Does not feel needs anything more at this point.  Follow.

## 2015-09-06 NOTE — Assessment & Plan Note (Signed)
Low cholesterol diet and exercise.  Follow lipid panel.   

## 2015-09-06 NOTE — Assessment & Plan Note (Signed)
Blood pressure under good control.  Continue same medication regimen.  Follow pressures.  Follow metabolic panel.   

## 2015-12-09 ENCOUNTER — Ambulatory Visit (INDEPENDENT_AMBULATORY_CARE_PROVIDER_SITE_OTHER): Payer: 59 | Admitting: Internal Medicine

## 2015-12-09 ENCOUNTER — Encounter: Payer: Self-pay | Admitting: Internal Medicine

## 2015-12-09 VITALS — BP 110/80 | HR 69 | Temp 98.2°F | Resp 18 | Ht 60.5 in | Wt 161.1 lb

## 2015-12-09 DIAGNOSIS — E7800 Pure hypercholesterolemia, unspecified: Secondary | ICD-10-CM | POA: Diagnosis not present

## 2015-12-09 DIAGNOSIS — Z86010 Personal history of colonic polyps: Secondary | ICD-10-CM

## 2015-12-09 DIAGNOSIS — Z1239 Encounter for other screening for malignant neoplasm of breast: Secondary | ICD-10-CM | POA: Diagnosis not present

## 2015-12-09 DIAGNOSIS — Z658 Other specified problems related to psychosocial circumstances: Secondary | ICD-10-CM

## 2015-12-09 DIAGNOSIS — Z0000 Encounter for general adult medical examination without abnormal findings: Secondary | ICD-10-CM

## 2015-12-09 DIAGNOSIS — I10 Essential (primary) hypertension: Secondary | ICD-10-CM | POA: Diagnosis not present

## 2015-12-09 DIAGNOSIS — F439 Reaction to severe stress, unspecified: Secondary | ICD-10-CM

## 2015-12-09 LAB — LIPID PANEL
CHOLESTEROL: 218 mg/dL — AB (ref 0–200)
HDL: 58.3 mg/dL (ref >39.00)
LDL Cholesterol: 127 mg/dL — ABNORMAL HIGH (ref 0–99)
NONHDL: 159.38
TRIGLYCERIDES: 163 mg/dL — AB (ref 0.0–149.0)
Total CHOL/HDL Ratio: 4
VLDL: 32.6 mg/dL (ref 0.0–40.0)

## 2015-12-09 LAB — BASIC METABOLIC PANEL
BUN: 12 mg/dL (ref 6–23)
CALCIUM: 9.8 mg/dL (ref 8.4–10.5)
CO2: 27 meq/L (ref 19–32)
CREATININE: 0.63 mg/dL (ref 0.40–1.20)
Chloride: 103 mEq/L (ref 96–112)
GFR: 103.42 mL/min (ref >60.00)
Glucose, Bld: 102 mg/dL — ABNORMAL HIGH (ref 70–99)
Potassium: 4.2 mEq/L (ref 3.5–5.1)
Sodium: 139 mEq/L (ref 135–145)

## 2015-12-09 LAB — HEPATIC FUNCTION PANEL
ALBUMIN: 4.7 g/dL (ref 3.5–5.2)
ALK PHOS: 67 U/L (ref 39–117)
ALT: 11 U/L (ref 0–35)
AST: 17 U/L (ref 0–37)
Bilirubin, Direct: 0.1 mg/dL (ref 0.0–0.3)
TOTAL PROTEIN: 7.2 g/dL (ref 6.0–8.3)
Total Bilirubin: 0.4 mg/dL (ref 0.2–1.2)

## 2015-12-09 MED ORDER — SERTRALINE HCL 50 MG PO TABS: 50.0000 mg | ORAL_TABLET | Freq: Every day | ORAL | Status: DC

## 2015-12-09 NOTE — Assessment & Plan Note (Signed)
Physical today 12/09/15.  Mammogram 12/16/14 - Birads I.  PAP 11/23/13 - negative with negative HPV.  Scheduled for follow up mammogram.  Colonoscopy 11/2010.  Recommend f/u colonoscopy in 2017.

## 2015-12-09 NOTE — Progress Notes (Signed)
Pre-visit discussion using our clinic review tool. No additional management support is needed unless otherwise documented below in the visit note.  

## 2015-12-09 NOTE — Progress Notes (Signed)
Patient ID: Ashley Waller, female   DOB: 11/26/1960, 55 y.o.   MRN: [id number]   Subjective:    Patient ID: Ashley Waller, female    DOB: 1960/12/30, 55 y.o.   MRN: [id number]  HPI  Patient here for her scheduled physical.  Increased stress and feeling some depression - mild.  She is not working now.  Looking for a job.  Increased stress with this.  Has been unsuccessful.  States physically doing ok.  Not exercising as much.  Tries to stay active.  Not watching her diet as well.  No chest pain.  No sob.  No acid reflux.  No abdominal pain or cramping.  Bowels stable.     Past Medical History  Diagnosis Date  . Hypertension   . Asthma   . Hematuria   . Arthritis   . Hemorrhoid    Past Surgical History  Procedure Laterality Date  . Tubal ligation  1995  . Colonoscopy  2013  . Eyelid laceration repair  2006   Family History  Problem Relation Age of Onset  . Hyperlipidemia Mother   . Diabetes Mother   . Hypertension Mother   . Hyperlipidemia Father   . Diabetes Father   . Hypertension Father   . Mental illness Maternal Grandmother   . Mental illness Maternal Grandfather   . Mental illness Paternal Grandmother   . Mental illness Paternal Grandfather    Social History   Social History  . Marital Status: Married    Spouse Name: N/A  . Number of Children: N/A  . Years of Education: N/A   Social History Main Topics  . Smoking status: Never Smoker   . Smokeless tobacco: Never Used  . Alcohol Use: 1.2 oz/week    2 Standard drinks or equivalent per week     Comment: ocassionally  . Drug Use: No  . Sexual Activity: Not Asked   Other Topics Concern  . None   Social History Narrative    Outpatient Encounter Prescriptions as of 12/09/2015  Medication Sig  . acetaminophen (TYLENOL) 325 MG tablet Take 650 mg by mouth every 6 (six) hours as needed for pain.  Marland Kitchen albuterol (PROVENTIL HFA;VENTOLIN HFA) 108 (90 BASE) MCG/ACT inhaler Inhale 2 puffs into the lungs every 6 (six) hours as  needed.  Marland Kitchen aspirin 81 MG tablet Take 81 mg by mouth daily.  . fluticasone (FLONASE) 50 MCG/ACT nasal spray Place 2 sprays into both nostrils daily. Hold until pt request  . loratadine (CLARITIN) 10 MG tablet Take 10 mg by mouth daily.  . montelukast (SINGULAIR) 10 MG tablet TAKE ONE (1) TABLET EACH DAY  . sertraline (ZOLOFT) 50 MG tablet Take 1 tablet (50 mg total) by mouth daily.  . [DISCONTINUED] sertraline (ZOLOFT) 50 MG tablet Take 1 tablet (50 mg total) by mouth daily.   No facility-administered encounter medications on file as of 12/09/2015.    Review of Systems  Constitutional: Negative for appetite change and unexpected weight change.  HENT: Negative for congestion and sinus pressure.   Eyes: Negative for pain and visual disturbance.  Respiratory: Negative for cough, chest tightness and shortness of breath.   Cardiovascular: Negative for chest pain, palpitations and leg swelling.  Gastrointestinal: Negative for nausea, vomiting, abdominal pain and diarrhea.  Genitourinary: Negative for dysuria and difficulty urinating.  Musculoskeletal: Negative for back pain and joint swelling.  Skin: Negative for color change and rash.  Neurological: Negative for dizziness, light-headedness and headaches.  Hematological: Negative for adenopathy. Does not  bruise/bleed easily.  Psychiatric/Behavioral: Negative for dysphoric mood and agitation.       Objective:     Blood pressure rechecked by me:  118/72  Physical Exam  Constitutional: She is oriented to person, place, and time. She appears well-developed and well-nourished. No distress.  HENT:  Nose: Nose normal.  Mouth/Throat: Oropharynx is clear and moist.  Eyes: Right eye exhibits no discharge. Left eye exhibits no discharge. No scleral icterus.  Neck: Neck supple. No thyromegaly present.  Cardiovascular: Normal rate and regular rhythm.   Pulmonary/Chest: Breath sounds normal. No accessory muscle usage. No tachypnea. No respiratory  distress. She has no decreased breath sounds. She has no wheezes. She has no rhonchi. Right breast exhibits no inverted nipple, no mass, no nipple discharge and no tenderness (no axillary adenopathy). Left breast exhibits no inverted nipple, no mass, no nipple discharge and no tenderness (no axilarry adenopathy).  Abdominal: Soft. Bowel sounds are normal. There is no tenderness.  Musculoskeletal: She exhibits no edema or tenderness.  Lymphadenopathy:    She has no cervical adenopathy.  Neurological: She is alert and oriented to person, place, and time.  Skin: Skin is warm. No rash noted.  Psychiatric: She has a normal mood and affect. Her behavior is normal.    BP 110/80 mmHg  Pulse 69  Temp(Src) 98.2 F (36.8 C) (Oral)  Resp 18  Ht 5' 0.5" (1.537 m)  Wt 161 lb 2 oz (73.086 kg)  BMI 30.94 kg/m2  SpO2 99%  LMP 07/16/2009 Wt Readings from Last 3 Encounters:  12/09/15 161 lb 2 oz (73.086 kg)  09/01/15 153 lb 6 oz (69.57 kg)  06/29/15 150 lb 2 oz (68.096 kg)     Lab Results  Component Value Date   WBC 5.6 06/29/2015   HGB 13.6 05/19/2014   HCT 41.1 06/29/2015   PLT 352 06/29/2015   GLUCOSE 102* 12/09/2015   CHOL 218* 12/09/2015   TRIG 163.0* 12/09/2015   HDL 58.30 12/09/2015   LDLDIRECT 158.7 05/19/2014   LDLCALC 127* 12/09/2015   ALT 11 12/09/2015   AST 17 12/09/2015   NA 139 12/09/2015   K 4.2 12/09/2015   CL 103 12/09/2015   CREATININE 0.63 12/09/2015   BUN 12 12/09/2015   CO2 27 12/09/2015   TSH 3.950 06/29/2015   HGBA1C 5.5 12/10/2014    Mm Digital Screening Bilateral  12/16/2014  CLINICAL DATA:  Screening. EXAM: DIGITAL SCREENING BILATERAL MAMMOGRAM WITH CAD COMPARISON:  Previous exam(s). ACR Breast Density Category b: There are scattered areas of fibroglandular density. FINDINGS: There are no findings suspicious for malignancy. Images were processed with CAD. IMPRESSION: No mammographic evidence of malignancy. A result letter of this screening mammogram will be  mailed directly to the patient. RECOMMENDATION: Screening mammogram in one year. (Code:SM-B-01Y) BI-RADS CATEGORY  1: Negative. Electronically Signed   By: Lajean Manes M.D.   On: 12/16/2014 11:14       Assessment & Plan:   Problem List Items Addressed This Visit    Health care maintenance    Physical today 12/09/15.  Mammogram 12/16/14 - Birads I.  PAP 11/23/13 - negative with negative HPV.  Scheduled for follow up mammogram.  Colonoscopy 11/2010.  Recommend f/u colonoscopy in 2017.        History of colonic polyps    Colonoscopy in 2012 - cecal polyp.  Recommended f/u colonoscopy in 2017.  Refer to GI.       Relevant Orders   Ambulatory referral to Gastroenterology  Hypercholesterolemia    Low cholesterol diet and exercise.  Follow lipid panel.       Relevant Orders   Lipid panel (Completed)   Hepatic function panel (Completed)   Hypertension    Blood pressure under good control.  Continue same medication regimen.  Follow pressures.  Follow metabolic panel.        Relevant Orders   Basic metabolic panel (Completed)   Stress    Increased stress as outlined.  Discussed with her today.  Does not feel needs anything more.  Follow.         Other Visit Diagnoses    Screening breast examination        Relevant Orders    MM DIGITAL SCREENING BILATERAL        Einar Pheasant, MD

## 2015-12-10 ENCOUNTER — Encounter: Payer: Self-pay | Admitting: *Deleted

## 2015-12-14 ENCOUNTER — Encounter: Payer: Self-pay | Admitting: Internal Medicine

## 2015-12-14 NOTE — Assessment & Plan Note (Signed)
Colonoscopy in 2012 - cecal polyp.  Recommended f/u colonoscopy in 2017.  Refer to GI.

## 2015-12-14 NOTE — Assessment & Plan Note (Signed)
Low cholesterol diet and exercise.  Follow lipid panel.   

## 2015-12-14 NOTE — Assessment & Plan Note (Signed)
Increased stress as outlined.  Discussed with her today.  Does not feel needs anything more.  Follow.

## 2015-12-14 NOTE — Assessment & Plan Note (Signed)
Blood pressure under good control.  Continue same medication regimen.  Follow pressures.  Follow metabolic panel.   

## 2015-12-18 ENCOUNTER — Ambulatory Visit: Payer: 59

## 2015-12-22 ENCOUNTER — Other Ambulatory Visit: Payer: Self-pay | Admitting: Internal Medicine

## 2015-12-22 ENCOUNTER — Ambulatory Visit
Admission: RE | Admit: 2015-12-22 | Discharge: 2015-12-22 | Disposition: A | Discharge status: Home / Self Care | Payer: 59 | Source: Ambulatory Visit | Attending: Internal Medicine | Admitting: Internal Medicine

## 2015-12-22 DIAGNOSIS — Z1231 Encounter for screening mammogram for malignant neoplasm of breast: Secondary | ICD-10-CM | POA: Insufficient documentation

## 2015-12-22 DIAGNOSIS — R928 Other abnormal and inconclusive findings on diagnostic imaging of breast: Secondary | ICD-10-CM

## 2015-12-22 DIAGNOSIS — Z1239 Encounter for other screening for malignant neoplasm of breast: Secondary | ICD-10-CM

## 2015-12-23 ENCOUNTER — Other Ambulatory Visit: Payer: Self-pay | Admitting: Internal Medicine

## 2015-12-23 DIAGNOSIS — R928 Other abnormal and inconclusive findings on diagnostic imaging of breast: Secondary | ICD-10-CM

## 2015-12-23 NOTE — Progress Notes (Signed)
Order placed for f/u right breast mammogram and right breast ultrasound.  

## 2016-01-04 ENCOUNTER — Encounter: Payer: Self-pay | Admitting: Emergency Medicine

## 2016-01-04 ENCOUNTER — Emergency Department: Payer: 59

## 2016-01-04 ENCOUNTER — Emergency Department
Admission: EM | Admit: 2016-01-04 | Discharge: 2016-01-04 | Disposition: A | Discharge status: Home / Self Care | Payer: 59 | Attending: Emergency Medicine | Admitting: Emergency Medicine

## 2016-01-04 DIAGNOSIS — M1990 Unspecified osteoarthritis, unspecified site: Secondary | ICD-10-CM | POA: Diagnosis not present

## 2016-01-04 DIAGNOSIS — I10 Essential (primary) hypertension: Secondary | ICD-10-CM | POA: Insufficient documentation

## 2016-01-04 DIAGNOSIS — J45909 Unspecified asthma, uncomplicated: Secondary | ICD-10-CM | POA: Diagnosis not present

## 2016-01-04 DIAGNOSIS — G510 Bell's palsy: Secondary | ICD-10-CM | POA: Diagnosis not present

## 2016-01-04 DIAGNOSIS — Z79899 Other long term (current) drug therapy: Secondary | ICD-10-CM | POA: Insufficient documentation

## 2016-01-04 DIAGNOSIS — Z7951 Long term (current) use of inhaled steroids: Secondary | ICD-10-CM | POA: Diagnosis not present

## 2016-01-04 DIAGNOSIS — R202 Paresthesia of skin: Secondary | ICD-10-CM | POA: Diagnosis present

## 2016-01-04 DIAGNOSIS — Z7982 Long term (current) use of aspirin: Secondary | ICD-10-CM | POA: Insufficient documentation

## 2016-01-04 MED ORDER — VALACYCLOVIR HCL 1 G PO TABS: 1000.0000 mg | ORAL_TABLET | Freq: Three times a day (TID) | ORAL | Status: AC

## 2016-01-04 MED ORDER — PREDNISONE 20 MG PO TABS: 60.0000 mg | ORAL_TABLET | Freq: Every day | ORAL | Status: DC

## 2016-01-04 NOTE — ED Notes (Signed)
C/O left facial numbness.  States numbness in intermittent.  Unable to close left eye by itself, but able to close both eyes together.  Onset of symptoms 1000.

## 2016-01-04 NOTE — Discharge Instructions (Signed)
Bell Palsy °Bell palsy is a condition in which the muscles on one side of the face become paralyzed. This often causes one side of the face to droop. It is a common condition and most people recover completely. °RISK FACTORS °Risk factors for Bell palsy include: °· Pregnancy. °· Diabetes. °· An infection by a virus, such as infections that cause cold sores. °CAUSES  °Bell palsy is caused by damage to or inflammation of a nerve in your face. It is unclear why this happens, but an infection by a virus may lead to it. Most of the time the reason it happens is unknown. °SIGNS AND SYMPTOMS  °Symptoms can range from mild to severe and can take place over a number of hours. Symptoms may include: °· Being unable to: °¨ Raise one or both eyebrows. °¨ Close one or both eyes. °¨ Feel parts of your face (facial numbness). °· Drooping of the eyelid and corner of the mouth. °· Weakness in the face. °· Paralysis of half your face. °· Loss of taste. °· Sensitivity to loud noises. °· Difficulty chewing. °· Tearing up of the affected eye. °· Dryness in the affected eye. °· Drooling. °· Pain behind one ear. °DIAGNOSIS  °Diagnosis of Bell palsy may include: °· A medical history and physical exam. °· An MRI. °· A CT scan. °· Electromyography (EMG). This is a test that checks how your nerves are working. °TREATMENT  °Treatment may include antiviral medicine to help shorten the length of the condition. Sometimes treatment is not needed and the symptoms go away on their own. °HOME CARE INSTRUCTIONS  °· Take medicines only as directed by your health care provider. °· Do facial massages and exercises as directed by your health care provider. °· If your eye is affected: °¨ Use moisturizing eye drops to prevent drying of your eye as directed by your health care provider. °¨ Protect your eye as directed by your health care provider. °SEEK MEDICAL CARE IF: °· Your symptoms do not get better or get worse. °· You are drooling. °· Your eye is red,  irritated, or hurts. °SEEK IMMEDIATE MEDICAL CARE IF:  °· Another part of your body feels weak or numb. °· You have difficulty swallowing. °· You have a fever along with symptoms of Bell palsy. °· You develop neck pain. °MAKE SURE YOU:  °· Understand these instructions. °· Will watch your condition. °· Will get help right away if you are not doing well or get worse. °  °This information is not intended to replace advice given to you by your health care provider. Make sure you discuss any questions you have with your health care provider. °  °Document Released: 07/18/2005 Document Revised: 04/08/2015 Document Reviewed: 10/25/2013 °Elsevier Interactive Patient Education ©2016 [employer] ° °

## 2016-01-04 NOTE — ED Notes (Signed)
Patient reports worst area of numbness is to left side of face, just beside nasal fold and under left eye. Facial droop noted. States she has dry and "strange" sensation to left eye.

## 2016-01-04 NOTE — ED Notes (Signed)
AAOx3.  Skin warm and dry.  Moving all extremities equally and strong.   Denies numbness to extremities.  Only complaint of "different" feeling is to left face.  Left mouth and left eye droop seen.  Tongue midline.

## 2016-01-04 NOTE — ED Provider Notes (Addendum)
[facility] Emergency Department Provider Note  Time seen: 4:20 PM  I have reviewed the triage vital signs and the nursing notes.   HISTORY  Chief Complaint left facial numbness     HPI Ashley Waller is a 55 y.o. female a past medical history of hypertension, asthma, arthritis, presents the emergency department with left facial droop. According to the patient around 10:00 this morning she noticed the left side of her face was drooping and feeling numb. Patient states she tried to drink something around 10:30 this morning and noticed that it was coming out of the left side of her mouth. Patient states her husband has had Bell's palsy 3 times, she thought it could be Bell's palsy, but was worried so she came to the emergency department for evaluation. Patient denies any weakness or numbness of either arm or leg, confusion or slurred speech. States left-side of her face feels numb, and she has noted no obvious droop which has worsened since arrival to the emergency department. Patient denies any fever. Does state mild left ear pain which began yesterday.     Past Medical History  Diagnosis Date  . Hypertension   . Asthma   . Hematuria   . Arthritis   . Hemorrhoid     Patient Active Problem List   Diagnosis Date Noted  . History of colonic polyps 02/27/2015  . Health care maintenance 11/30/2014  . Stress 05/17/2014  . Plantar fasciitis of left foot 07/15/2013  . Abnormal mammogram 10/15/2012  . Hypertension 07/22/2012  . Menopausal syndrome 07/22/2012  . Hypercholesterolemia 07/16/2012    Past Surgical History  Procedure Laterality Date  . Tubal ligation  1995  . Colonoscopy  2013  . Eyelid laceration repair  2006    Current Outpatient Rx  Name  Route  Sig  Dispense  Refill  . acetaminophen (TYLENOL) 325 MG tablet   Oral   Take 650 mg by mouth every 6 (six) hours as needed for pain.         Marland Kitchen albuterol (PROVENTIL HFA;VENTOLIN HFA) 108 (90 BASE)  MCG/ACT inhaler   Inhalation   Inhale 2 puffs into the lungs every 6 (six) hours as needed.   6.7 g   3   . aspirin 81 MG tablet   Oral   Take 81 mg by mouth daily.         . fluticasone (FLONASE) 50 MCG/ACT nasal spray   Each Nare   Place 2 sprays into both nostrils daily. Hold until pt request   48 g   1   . loratadine (CLARITIN) 10 MG tablet   Oral   Take 10 mg by mouth daily.         . montelukast (SINGULAIR) 10 MG tablet      TAKE ONE (1) TABLET EACH DAY   90 tablet   1     Hold until pt request   . sertraline (ZOLOFT) 50 MG tablet   Oral   Take 1 tablet (50 mg total) by mouth daily.   90 tablet   1     Hold until pt request refill.     Allergies Penicillins  Family History  Problem Relation Age of Onset  . Hyperlipidemia Mother   . Diabetes Mother   . Hypertension Mother   . Hyperlipidemia Father   . Diabetes Father   . Hypertension Father   . Mental illness Maternal Grandmother   . Mental illness Maternal Grandfather   .  Mental illness Paternal Grandmother   . Mental illness Paternal Grandfather     Social History Social History  Substance Use Topics  . Smoking status: Never Smoker   . Smokeless tobacco: Never Used  . Alcohol Use: 1.2 oz/week    2 Standard drinks or equivalent per week     Comment: ocassionally    Review of Systems Constitutional: Negative for fever Cardiovascular: Negative for chest pain. Respiratory: Negative for shortness of breath. Gastrointestinal: Negative for abdominal pain Musculoskeletal: Negative for back pain. Skin: Negative for rash. Neurological: Negative for headache. Positive for left facial weakness and numbness. 10-point ROS otherwise negative.  ____________________________________________   PHYSICAL EXAM:  VITAL SIGNS: ED Triage Vitals  Enc Vitals Group     BP 01/04/16 1420 123/74 mmHg     Pulse Rate 01/04/16 1420 88     Resp 01/04/16 1420 16     Temp 01/04/16 1420 98.2 F (36.8 C)      Temp Source 01/04/16 1420 Oral     SpO2 01/04/16 1420 98 %     Weight 01/04/16 1420 150 lb (68.04 kg)     Height 01/04/16 1420 5\' 1"  (1.549 m)     Head Cir --      Peak Flow --      Pain Score 01/04/16 1422 0     Pain Loc --      Pain Edu? --      Excl. in [place]? --     Constitutional: Alert and oriented. Well appearing and in no distress. Eyes: Normal exam ENT   Head: Normocephalic and atraumatic. Normal tympanic membranes.   Mouth/Throat: Mucous membranes are moist. Cardiovascular: Normal rate, regular rhythm. No murmur Respiratory: Normal respiratory effort without tachypnea nor retractions. Breath sounds are clear Gastrointestinal: Soft and nontender. No distention. Musculoskeletal: Nontender with normal range of motion in all extremities. Neurologic:  Normal speech and language. Moderate left-sided facial droop. Patient is not able to completely close the left eye. Patient does state a mild subjective numbness to the left face as well. No pronator drift. 5/5 motor in all extremities. Sensation intact in all extremities. Skin:  Skin is warm, dry and intact.  Psychiatric: Mood and affect are normal. Speech and behavior are normal.   ____________________________________________     RADIOLOGY  MRI normal.  ____________________________________________    INITIAL IMPRESSION / ASSESSMENT AND PLAN / ED COURSE  Pertinent labs & imaging results that were available during my care of the patient were reviewed by me and considered in my medical decision making (see chart for details).  The patient presents the emergency department left facial droop and numbness which began approximately 10:00 this morning. Patient's exam is very consistent with Bell's palsy including the ear pain. However the patient does continue to state a subjective mild numbness to the left face as well which would be atypical for Bell's palsy. Patient states it is difficult to tell if it is not been not  given the weakness. However due to the subjective numbness will proceed with an MRI to rule out CVA. Highly suspect Bell's palsy at this point.  MRI is normal. We'll discharge with Valtrex and PCP follow-up for Bell's palsy.  ____________________________________________   FINAL CLINICAL IMPRESSION(S) / ED DIAGNOSES  Left facial droop Bell's palsy  Harvest Dark, MD 01/04/16 1737  Harvest Dark, MD 01/04/16 [phone]

## 2016-01-05 ENCOUNTER — Encounter: Payer: Self-pay | Admitting: *Deleted

## 2016-01-11 ENCOUNTER — Ambulatory Visit
Admission: RE | Admit: 2016-01-11 | Discharge: 2016-01-11 | Disposition: A | Discharge status: Home / Self Care | Payer: 59 | Source: Ambulatory Visit | Attending: Internal Medicine | Admitting: Internal Medicine

## 2016-01-11 DIAGNOSIS — R928 Other abnormal and inconclusive findings on diagnostic imaging of breast: Secondary | ICD-10-CM | POA: Diagnosis present

## 2016-03-10 ENCOUNTER — Ambulatory Visit (INDEPENDENT_AMBULATORY_CARE_PROVIDER_SITE_OTHER): Payer: 59 | Admitting: Internal Medicine

## 2016-03-10 ENCOUNTER — Encounter: Payer: Self-pay | Admitting: Internal Medicine

## 2016-03-10 VITALS — BP 110/80 | HR 79 | Temp 98.3°F | Resp 18 | Ht 60.5 in | Wt 170.5 lb

## 2016-03-10 DIAGNOSIS — R079 Chest pain, unspecified: Secondary | ICD-10-CM

## 2016-03-10 DIAGNOSIS — I10 Essential (primary) hypertension: Secondary | ICD-10-CM

## 2016-03-10 DIAGNOSIS — E7800 Pure hypercholesterolemia, unspecified: Secondary | ICD-10-CM

## 2016-03-10 DIAGNOSIS — Z658 Other specified problems related to psychosocial circumstances: Secondary | ICD-10-CM

## 2016-03-10 DIAGNOSIS — Z86010 Personal history of colonic polyps: Secondary | ICD-10-CM | POA: Diagnosis not present

## 2016-03-10 DIAGNOSIS — F439 Reaction to severe stress, unspecified: Secondary | ICD-10-CM

## 2016-03-10 NOTE — Progress Notes (Signed)
Pre-visit discussion using our clinic review tool. No additional management support is needed unless otherwise documented below in the visit note.  

## 2016-03-10 NOTE — Progress Notes (Signed)
Patient ID: Ashley Waller, female   DOB: 12/19/1960, 55 y.o.   MRN: [id number]   Subjective:    Patient ID: Ashley Waller, female    DOB: 01/31/1961, 55 y.o.   MRN: [id number]  HPI  Patient here for a scheduled follow up.  She has been under increased stress recently.  Has not been able to find a job.  Still looking.  Not exercising.  When she gets stressed, she eats.  Has been eating more.  Gained weight.  Discussed with her today.  She does not feel needs anything more for the stress.  Has good support and people she can talk to.  She does report that 2-3 weeks ago, she noticed a cramp in her chest - anterior chest.  She was sitting.  She just sat still and it went away.  No chest pain with increased activity or exertion.  No sob.  No acid reflux.  No abdominal pain or cramping.  Bowels stable.     Past Medical History:  Diagnosis Date  . Arthritis   . Asthma   . Hematuria   . Hemorrhoid   . Hypertension    Past Surgical History:  Procedure Laterality Date  . COLONOSCOPY  2013  . EYELID LACERATION REPAIR  2006  . TUBAL LIGATION  1995   Family History  Problem Relation Age of Onset  . Hyperlipidemia Mother   . Diabetes Mother   . Hypertension Mother   . Hyperlipidemia Father   . Diabetes Father   . Hypertension Father   . Mental illness Maternal Grandmother   . Mental illness Maternal Grandfather   . Mental illness Paternal Grandmother   . Mental illness Paternal Grandfather    Social History   Social History  . Marital status: Married    Spouse name: N/A  . Number of children: N/A  . Years of education: N/A   Social History Main Topics  . Smoking status: Never Smoker  . Smokeless tobacco: Never Used  . Alcohol use 1.2 oz/week    2 Standard drinks or equivalent per week     Comment: ocassionally  . Drug use: No  . Sexual activity: Not Asked   Other Topics Concern  . None   Social History Narrative  . None    Outpatient Encounter Prescriptions as of 03/10/2016    Medication Sig  . acetaminophen (TYLENOL) 325 MG tablet Take 650 mg by mouth every 6 (six) hours as needed for pain.  Marland Kitchen albuterol (PROVENTIL HFA;VENTOLIN HFA) 108 (90 BASE) MCG/ACT inhaler Inhale 2 puffs into the lungs every 6 (six) hours as needed.  Marland Kitchen aspirin 81 MG tablet Take 81 mg by mouth daily.  . fluticasone (FLONASE) 50 MCG/ACT nasal spray Place 2 sprays into both nostrils daily. Hold until pt request  . loratadine (CLARITIN) 10 MG tablet Take 10 mg by mouth daily.  . montelukast (SINGULAIR) 10 MG tablet TAKE ONE (1) TABLET EACH DAY  . predniSONE (DELTASONE) 20 MG tablet Take 3 tablets (60 mg total) by mouth daily.  . sertraline (ZOLOFT) 50 MG tablet Take 1 tablet (50 mg total) by mouth daily.   No facility-administered encounter medications on file as of 03/10/2016.     Review of Systems  Constitutional:       She has been eating more.  Not watching her diet.  Gained weight.   HENT: Negative for congestion and sinus pressure.   Respiratory: Negative for cough, shortness of breath and wheezing.   Cardiovascular: Positive for  chest pain. Negative for palpitations and leg swelling.  Gastrointestinal: Negative for abdominal pain, diarrhea, nausea and vomiting.  Genitourinary: Negative for difficulty urinating and dysuria.  Musculoskeletal: Negative for back pain and joint swelling.  Skin: Negative for color change and rash.  Neurological: Negative for dizziness, light-headedness and headaches.  Psychiatric/Behavioral: Negative for agitation and dysphoric mood.       Objective:    Physical Exam  Constitutional: She appears well-developed and well-nourished. No distress.  HENT:  Nose: Nose normal.  Mouth/Throat: Oropharynx is clear and moist.  Neck: Neck supple. No thyromegaly present.  Cardiovascular: Normal rate and regular rhythm.   Pulmonary/Chest: Breath sounds normal. No respiratory distress. She has no wheezes.  Abdominal: Soft. Bowel sounds are normal. There is no  tenderness.  Musculoskeletal: She exhibits no edema or tenderness.  Lymphadenopathy:    She has no cervical adenopathy.  Skin: No rash noted. No erythema.  Psychiatric: She has a normal mood and affect. Her behavior is normal.    BP 110/80   Pulse 79   Temp 98.3 F (36.8 C) (Oral)   Resp 18   Ht 5' 0.5" (1.537 m)   Wt 170 lb 8 oz (77.3 kg)   LMP 07/16/2009   SpO2 98%   BMI 32.75 kg/m  Wt Readings from Last 3 Encounters:  03/10/16 170 lb 8 oz (77.3 kg)  01/04/16 150 lb (68 kg)  12/09/15 161 lb 2 oz (73.1 kg)     Lab Results  Component Value Date   WBC 5.6 06/29/2015   HGB 13.6 05/19/2014   HCT 41.1 06/29/2015   PLT 352 06/29/2015   GLUCOSE 102 (H) 12/09/2015   CHOL 218 (H) 12/09/2015   TRIG 163.0 (H) 12/09/2015   HDL 58.30 12/09/2015   LDLDIRECT 158.7 05/19/2014   LDLCALC 127 (H) 12/09/2015   ALT 11 12/09/2015   AST 17 12/09/2015   NA 139 12/09/2015   K 4.2 12/09/2015   CL 103 12/09/2015   CREATININE 0.63 12/09/2015   BUN 12 12/09/2015   CO2 27 12/09/2015   TSH 3.950 06/29/2015   HGBA1C 5.5 12/10/2014    Mm Diag Breast Tomo Uni Right  Result Date: 01/11/2016 CLINICAL DATA:  Recall from screening for possible mass 12 o'clock right breast EXAM: 2D DIGITAL DIAGNOSTIC UNILATERAL RIGHT MAMMOGRAM WITH CAD AND ADJUNCT TOMO COMPARISON:  Previous exam(s). ACR Breast Density Category b: There are scattered areas of fibroglandular density. FINDINGS: No abnormalities on the current examination. Mammographic images were processed with CAD. IMPRESSION: Negative RECOMMENDATION: Screening mammogram in 1 year I have discussed the findings and recommendations with the patient. Results were also provided in writing at the conclusion of the visit. If applicable, a reminder letter will be sent to the patient regarding the next appointment. BI-RADS CATEGORY  1: Negative. Electronically Signed   By: Skipper Cliche M.D.   On: 01/11/2016 15:15       Assessment & Plan:   Problem List  Items Addressed This Visit    Chest pain - Primary    Chest pain as outlined.  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  Discussed with her today.  Discussed further w/up including stress test and referral to cardiology.  She declines at this time.  Wants to hold at this point.  Follow.        Relevant Orders   EKG 12-Lead (Completed)   History of colonic polyps    Colonoscopy 2012.  Due this year.  Was referred.  She  plans to get this rescheduled.  Had to pospone secondary to cost.        Hypercholesterolemia    Low cholesterol diet and exercise.  Follow lipid panel.        Relevant Orders   Lipid panel   Hepatic function panel   Hypertension    Blood pressure under good control.  Continue same medication regimen.  Follow pressures.  Follow metabolic panel.        Relevant Orders   TSH   CBC with Differential/Platelet   Basic metabolic panel   Stress    Increased stress as outlined.  Discussed with her today.  She has good support.  Does not feel needs any further intervention at this time.  Follow.         Other Visit Diagnoses   None.      Einar Pheasant, MD

## 2016-03-13 ENCOUNTER — Encounter: Payer: Self-pay | Admitting: Internal Medicine

## 2016-03-13 DIAGNOSIS — R079 Chest pain, unspecified: Secondary | ICD-10-CM | POA: Insufficient documentation

## 2016-03-13 HISTORY — DX: Chest pain, unspecified: R07.9

## 2016-03-13 NOTE — Assessment & Plan Note (Signed)
Blood pressure under good control.  Continue same medication regimen.  Follow pressures.  Follow metabolic panel.   

## 2016-03-13 NOTE — Assessment & Plan Note (Signed)
Colonoscopy 2012.  Due this year.  Was referred.  She plans to get this rescheduled.  Had to pospone secondary to cost.

## 2016-03-13 NOTE — Assessment & Plan Note (Signed)
Chest pain as outlined.  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  Discussed with her today.  Discussed further w/up including stress test and referral to cardiology.  She declines at this time.  Wants to hold at this point.  Follow.

## 2016-03-13 NOTE — Assessment & Plan Note (Signed)
Low cholesterol diet and exercise.  Follow lipid panel.   

## 2016-03-13 NOTE — Assessment & Plan Note (Signed)
Increased stress as outlined.  Discussed with her today.  She has good support. Does not feel needs any further intervention at this time.  Follow.  

## 2016-03-23 ENCOUNTER — Telehealth: Payer: Self-pay | Admitting: Internal Medicine

## 2016-03-23 NOTE — Telephone Encounter (Signed)
Pt needs to know the dates of all her pap since she has been at our office.. And also her colonoscopy. She  would like these dates mailed to her .Marland Kitchen Please advise pt with any question

## 2016-03-23 NOTE — Telephone Encounter (Signed)
Dates have been mailed.

## 2016-05-17 ENCOUNTER — Other Ambulatory Visit (INDEPENDENT_AMBULATORY_CARE_PROVIDER_SITE_OTHER): Payer: 59

## 2016-05-17 DIAGNOSIS — I10 Essential (primary) hypertension: Secondary | ICD-10-CM | POA: Diagnosis not present

## 2016-05-17 DIAGNOSIS — E7800 Pure hypercholesterolemia, unspecified: Secondary | ICD-10-CM

## 2016-05-17 NOTE — Addendum Note (Signed)
Addended by: Frutoso Chase A on: 05/17/2016 08:27 AM   Modules accepted: Orders

## 2016-05-18 LAB — CBC WITH DIFFERENTIAL/PLATELET
Basophils Absolute: 0 10*3/uL (ref 0.0–0.2)
Basos: 1 %
EOS (ABSOLUTE): 0.1 10*3/uL (ref 0.0–0.4)
Eos: 2 %
Hematocrit: 39.5 % (ref 34.0–46.6)
Hemoglobin: 13.2 g/dL (ref 11.1–15.9)
IMMATURE GRANULOCYTES: 0 %
Immature Grans (Abs): 0 10*3/uL (ref 0.0–0.1)
Lymphocytes Absolute: 2.1 10*3/uL (ref 0.7–3.1)
Lymphs: 43 %
MCH: 30.1 pg (ref 26.6–33.0)
MCHC: 33.4 g/dL (ref 31.5–35.7)
MCV: 90 fL (ref 79–97)
MONOS ABS: 0.3 10*3/uL (ref 0.1–0.9)
Monocytes: 7 %
NEUTROS PCT: 47 %
Neutrophils Absolute: 2.3 10*3/uL (ref 1.4–7.0)
PLATELETS: 306 10*3/uL (ref 150–379)
RBC: 4.38 x10E6/uL (ref 3.77–5.28)
RDW: 12.1 % — AB (ref 12.3–15.4)
WBC: 4.8 10*3/uL (ref 3.4–10.8)

## 2016-05-18 LAB — TSH: TSH: 3.27 u[IU]/mL (ref 0.450–4.500)

## 2016-05-18 LAB — LIPID PANEL
Chol/HDL Ratio: 4.3 ratio units (ref 0.0–4.4)
Cholesterol, Total: 227 mg/dL — ABNORMAL HIGH (ref 100–199)
HDL: 53 mg/dL (ref >39)
LDL CALC: 139 mg/dL — AB (ref 0–99)
Triglycerides: 175 mg/dL — ABNORMAL HIGH (ref 0–149)
VLDL CHOLESTEROL CAL: 35 mg/dL (ref 5–40)

## 2016-05-18 LAB — HEPATIC FUNCTION PANEL
ALK PHOS: 82 IU/L (ref 39–117)
ALT: 7 IU/L (ref 0–32)
AST: 11 IU/L (ref 0–40)
Albumin: 4.4 g/dL (ref 3.5–5.5)
Bilirubin Total: 0.4 mg/dL (ref 0.0–1.2)
Bilirubin, Direct: 0.09 mg/dL (ref 0.00–0.40)
TOTAL PROTEIN: 6.8 g/dL (ref 6.0–8.5)

## 2016-05-18 LAB — BASIC METABOLIC PANEL
BUN/Creatinine Ratio: 20 (ref 9–23)
BUN: 13 mg/dL (ref 6–24)
CALCIUM: 9.4 mg/dL (ref 8.7–10.2)
CO2: 28 mmol/L (ref 18–29)
CREATININE: 0.64 mg/dL (ref 0.57–1.00)
Chloride: 101 mmol/L (ref 96–106)
GFR calc Af Amer: 115 mL/min/{1.73_m2} (ref >59)
GFR calc non Af Amer: 99 mL/min/{1.73_m2} (ref >59)
GLUCOSE: 105 mg/dL — AB (ref 65–99)
POTASSIUM: 5.1 mmol/L (ref 3.5–5.2)
SODIUM: 144 mmol/L (ref 134–144)

## 2016-05-20 ENCOUNTER — Encounter: Payer: Self-pay | Admitting: Internal Medicine

## 2016-05-20 ENCOUNTER — Ambulatory Visit (INDEPENDENT_AMBULATORY_CARE_PROVIDER_SITE_OTHER): Payer: 59 | Admitting: Internal Medicine

## 2016-05-20 DIAGNOSIS — F439 Reaction to severe stress, unspecified: Secondary | ICD-10-CM

## 2016-05-20 DIAGNOSIS — I10 Essential (primary) hypertension: Secondary | ICD-10-CM | POA: Diagnosis not present

## 2016-05-20 DIAGNOSIS — R928 Other abnormal and inconclusive findings on diagnostic imaging of breast: Secondary | ICD-10-CM | POA: Diagnosis not present

## 2016-05-20 DIAGNOSIS — Z23 Encounter for immunization: Secondary | ICD-10-CM

## 2016-05-20 NOTE — Progress Notes (Signed)
Patient ID: Ashley Waller, female   DOB: [birth year], 55 y.o.   MRN: [id number]   Subjective:    Patient ID: Ashley Waller, female    DOB: 1960/11/18, 55 y.o.   MRN: [id number]  HPI  Patient here for a scheduled follow up.  States she is doing better.  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  In more of a routine.  Feels better.  Handling stress better.  Has started back exercising.  Watching her diet.  No chest pain.  No further episodes.  No sob.  No acid reflux.  No abdominal pain or cramping.  Bowels stable.  Overall feels better.     Past Medical History:  Diagnosis Date  . Arthritis   . Asthma   . Hematuria   . Hemorrhoid   . Hypertension    Past Surgical History:  Procedure Laterality Date  . COLONOSCOPY  2013  . EYELID LACERATION REPAIR  2006  . TUBAL LIGATION  1995   Family History  Problem Relation Age of Onset  . Hyperlipidemia Mother   . Diabetes Mother   . Hypertension Mother   . Hyperlipidemia Father   . Diabetes Father   . Hypertension Father   . Mental illness Maternal Grandmother   . Mental illness Maternal Grandfather   . Mental illness Paternal Grandmother   . Mental illness Paternal Grandfather    Social History   Social History  . Marital status: Married    Spouse name: N/A  . Number of children: N/A  . Years of education: N/A   Social History Main Topics  . Smoking status: Never Smoker  . Smokeless tobacco: Never Used  . Alcohol use 1.2 oz/week    2 Standard drinks or equivalent per week     Comment: ocassionally  . Drug use: No  . Sexual activity: Not Asked   Other Topics Concern  . None   Social History Narrative  . None    Outpatient Encounter Prescriptions as of 05/20/2016  Medication Sig  . acetaminophen (TYLENOL) 325 MG tablet Take 650 mg by mouth every 6 (six) hours as needed for pain.  Marland Kitchen albuterol (PROVENTIL HFA;VENTOLIN HFA) 108 (90 BASE) MCG/ACT inhaler Inhale 2 puffs into the lungs every 6 (six) hours as  needed.  Marland Kitchen aspirin 81 MG tablet Take 81 mg by mouth daily.  . fluticasone (FLONASE) 50 MCG/ACT nasal spray Place 2 sprays into both nostrils daily. Hold until pt request  . loratadine (CLARITIN) 10 MG tablet Take 10 mg by mouth daily.  . montelukast (SINGULAIR) 10 MG tablet TAKE ONE (1) TABLET EACH DAY  . sertraline (ZOLOFT) 50 MG tablet Take 1 tablet (50 mg total) by mouth daily.  . [DISCONTINUED] predniSONE (DELTASONE) 20 MG tablet Take 3 tablets (60 mg total) by mouth daily.   No facility-administered encounter medications on file as of 05/20/2016.     Review of Systems  Constitutional: Negative for appetite change and unexpected weight change.  HENT: Negative for congestion and sinus pressure.   Respiratory: Negative for cough, chest tightness and shortness of breath.   Cardiovascular: Negative for chest pain, palpitations and leg swelling.  Gastrointestinal: Negative for abdominal pain, diarrhea, nausea and vomiting.  Genitourinary: Negative for difficulty urinating and dysuria.  Musculoskeletal: Negative for back pain and joint swelling.  Skin: Negative for color change and rash.  Neurological: Negative for dizziness, light-headedness and headaches.  Psychiatric/Behavioral: Negative for agitation and dysphoric mood.  Objective:    Physical Exam  Constitutional: She appears well-developed and well-nourished. No distress.  HENT:  Nose: Nose normal.  Mouth/Throat: Oropharynx is clear and moist.  Neck: Neck supple. No thyromegaly present.  Cardiovascular: Normal rate and regular rhythm.   Pulmonary/Chest: Breath sounds normal. No respiratory distress. She has no wheezes.  Abdominal: Soft. Bowel sounds are normal. There is no tenderness.  Musculoskeletal: She exhibits no edema or tenderness.  Lymphadenopathy:    She has no cervical adenopathy.  Skin: No rash noted. No erythema.  Psychiatric: She has a normal mood and affect. Her behavior is normal.    BP 108/70    Pulse 74   Temp 98.4 F (36.9 C) (Oral)   Ht 5\' 1"  (1.549 m)   Wt 174 lb 6.4 oz (79.1 kg)   LMP 07/16/2009   SpO2 98%   BMI 32.95 kg/m  Wt Readings from Last 3 Encounters:  05/20/16 174 lb 6.4 oz (79.1 kg)  03/10/16 170 lb 8 oz (77.3 kg)  01/04/16 150 lb (68 kg)     Lab Results  Component Value Date   WBC 4.8 05/17/2016   HGB 13.6 05/19/2014   HCT 39.5 05/17/2016   PLT 306 05/17/2016   GLUCOSE 105 (H) 05/17/2016   CHOL 227 (H) 05/17/2016   TRIG 175 (H) 05/17/2016   HDL 53 05/17/2016   LDLDIRECT 158.7 05/19/2014   LDLCALC 139 (H) 05/17/2016   ALT 7 05/17/2016   AST 11 05/17/2016   NA 144 05/17/2016   K 5.1 05/17/2016   CL 101 05/17/2016   CREATININE 0.64 05/17/2016   BUN 13 05/17/2016   CO2 28 05/17/2016   TSH 3.270 05/17/2016   HGBA1C 5.5 12/10/2014    Mm Diag Breast Tomo Uni Right  Result Date: 01/11/2016 CLINICAL DATA:  Recall from screening for possible mass 12 o'clock right breast EXAM: 2D DIGITAL DIAGNOSTIC UNILATERAL RIGHT MAMMOGRAM WITH CAD AND ADJUNCT TOMO COMPARISON:  Previous exam(s). ACR Breast Density Category b: There are scattered areas of fibroglandular density. FINDINGS: No abnormalities on the current examination. Mammographic images were processed with CAD. IMPRESSION: Negative RECOMMENDATION: Screening mammogram in 1 year I have discussed the findings and recommendations with the patient. Results were also provided in writing at the conclusion of the visit. If applicable, a reminder letter will be sent to the patient regarding the next appointment. BI-RADS CATEGORY  1: Negative. Electronically Signed   By: Skipper Cliche M.D.   On: 01/11/2016 15:15       Assessment & Plan:   Problem List Items Addressed This Visit    Abnormal mammogram    Mammogram 12/22/15 recommended f/u views.  F/u right breast mammogram - Birads I.       Relevant Orders   Hepatic function panel   Lipid panel   Hypertension    Blood pressure under good control.  Continue  same medication regimen.  Follow pressures.  Follow metabolic panel.        Relevant Orders   Basic metabolic panel   Stress    Doing better as outlined.  On zoloft.  Continue.  Follow.        Other Visit Diagnoses    Encounter for immunization       Relevant Orders   Flu Vaccine QUAD 36+ mos IM (Completed)       Einar Pheasant, MD

## 2016-05-20 NOTE — Progress Notes (Signed)
Pre visit review using our clinic review tool, if applicable. No additional management support is needed unless otherwise documented below in the visit note. 

## 2016-05-21 ENCOUNTER — Encounter: Payer: Self-pay | Admitting: Internal Medicine

## 2016-05-21 NOTE — Assessment & Plan Note (Signed)
Doing better as outlined.  On zoloft.  Continue.  Follow.

## 2016-05-21 NOTE — Assessment & Plan Note (Signed)
Mammogram 12/22/15 recommended f/u views.  F/u right breast mammogram - Bir[facility] I.

## 2016-05-21 NOTE — Assessment & Plan Note (Signed)
Blood pressure under good control.  Continue same medication regimen.  Follow pressures.  Follow metabolic panel.   

## 2016-06-20 ENCOUNTER — Telehealth: Payer: Self-pay | Admitting: *Deleted

## 2016-06-20 NOTE — Telephone Encounter (Signed)
Printed, patient notified it is ready to be picked up

## 2016-06-20 NOTE — Telephone Encounter (Signed)
Pt requested a copy of her flu shot Pt contact [phone] Please call when ready for pick up

## 2016-09-20 ENCOUNTER — Telehealth: Payer: Self-pay | Admitting: *Deleted

## 2016-09-20 ENCOUNTER — Other Ambulatory Visit (INDEPENDENT_AMBULATORY_CARE_PROVIDER_SITE_OTHER): Payer: 59

## 2016-09-20 DIAGNOSIS — Z0000 Encounter for general adult medical examination without abnormal findings: Secondary | ICD-10-CM | POA: Diagnosis not present

## 2016-09-20 DIAGNOSIS — I10 Essential (primary) hypertension: Secondary | ICD-10-CM | POA: Diagnosis not present

## 2016-09-20 DIAGNOSIS — R928 Other abnormal and inconclusive findings on diagnostic imaging of breast: Secondary | ICD-10-CM | POA: Diagnosis not present

## 2016-09-20 DIAGNOSIS — R739 Hyperglycemia, unspecified: Secondary | ICD-10-CM

## 2016-09-20 DIAGNOSIS — Z0189 Encounter for other specified special examinations: Secondary | ICD-10-CM

## 2016-09-20 NOTE — Addendum Note (Signed)
Addended by: Leeanne Rio on: 09/20/2016 08:03 AM   Modules accepted: Orders

## 2016-09-20 NOTE — Telephone Encounter (Signed)
Pt came in for labs this morning. She has an OV tomorrow. I drew a lavender EDTA tube in case you wanted to add a CBC or A1C. If you decide to add, please order through [facility]. Her husband is a Armed forces logistics/support/administrative officer.  Thanks

## 2016-09-20 NOTE — Addendum Note (Signed)
Addended by: Leeanne Rio on: 09/20/2016 09:58 AM   Modules accepted: Orders

## 2016-09-20 NOTE — Telephone Encounter (Signed)
I added ordered a1c.  Other labs are ordered.  Thanks

## 2016-09-21 ENCOUNTER — Encounter: Payer: Self-pay | Admitting: Internal Medicine

## 2016-09-21 ENCOUNTER — Ambulatory Visit (INDEPENDENT_AMBULATORY_CARE_PROVIDER_SITE_OTHER): Payer: 59 | Admitting: Internal Medicine

## 2016-09-21 DIAGNOSIS — I10 Essential (primary) hypertension: Secondary | ICD-10-CM | POA: Diagnosis not present

## 2016-09-21 DIAGNOSIS — E7800 Pure hypercholesterolemia, unspecified: Secondary | ICD-10-CM

## 2016-09-21 DIAGNOSIS — F439 Reaction to severe stress, unspecified: Secondary | ICD-10-CM | POA: Diagnosis not present

## 2016-09-21 LAB — HEMOGLOBIN A1C
Est. average glucose Bld gHb Est-mCnc: 105
HEMOGLOBIN A1C: 5.3 % (ref 4.8–5.6)

## 2016-09-21 LAB — LIPID PANEL
CHOLESTEROL TOTAL: 211 mg/dL — AB (ref 100–199)
Chol/HDL Ratio: 3.8 (ref 0.0–4.4)
HDL: 56 mg/dL (ref >39)
LDL Calculated: 126 — ABNORMAL HIGH (ref 0–99)
TRIGLYCERIDES: 146 mg/dL (ref 0–149)
VLDL CHOLESTEROL CAL: 29 (ref 5–40)

## 2016-09-21 LAB — HEPATIC FUNCTION PANEL
ALT: 11 IU/L (ref 0–32)
AST: 16 IU/L (ref 0–40)
Albumin: 4.5 g/dL (ref 3.5–5.5)
Alkaline Phosphatase: 80 IU/L (ref 39–117)
BILIRUBIN TOTAL: 0.4 mg/dL (ref 0.0–1.2)
Bilirubin, Direct: 0.1 mg/dL (ref 0.00–0.40)
Total Protein: 6.7 g/dL (ref 6.0–8.5)

## 2016-09-21 LAB — BASIC METABOLIC PANEL
BUN/Creatinine Ratio: 19 (ref 9–23)
BUN: 10 mg/dL (ref 6–24)
CALCIUM: 9.5 mg/dL (ref 8.7–10.2)
CHLORIDE: 100 mmol/L (ref 96–106)
CO2: 24 mmol/L (ref 18–29)
Creatinine, Ser: 0.53 mg/dL — ABNORMAL LOW (ref 0.57–1.00)
GFR calc non Af Amer: 105 (ref >59)
GFR, EST AFRICAN AMERICAN: 121 (ref >59)
Glucose: 96 mg/dL (ref 65–99)
POTASSIUM: 4.3 mmol/L (ref 3.5–5.2)
SODIUM: 142 mmol/L (ref 134–144)

## 2016-09-21 MED ORDER — SERTRALINE HCL 50 MG PO TABS: 50.0000 mg | ORAL_TABLET | Freq: Every day | ORAL | 1 refills | Status: DC

## 2016-09-21 NOTE — Progress Notes (Signed)
Pre-visit discussion using our clinic review tool. No additional management support is needed unless otherwise documented below in the visit note.  

## 2016-09-21 NOTE — Progress Notes (Signed)
Patient ID: Ashley Waller, female   DOB: August 07, 1960, 56 y.o.   MRN: [id number]   Subjective:    Patient ID: Ashley Waller, female    DOB: 05-06-1961, 56 y.o.   MRN: [id number]  HPI  Patient here for a scheduled follow up.  She reports she is doing better.  Has a new job.  Enjoying.  Overall doing better.  Stress is better.  Has gained some weight.  Plans to get more serious about diet and exercise.  No chest pain.  No sob.  No acid reflux.  No abdominal pain.  Bowels stable.  Discussed lab results.     Past Medical History:  Diagnosis Date  . Arthritis   . Asthma   . Hematuria   . Hemorrhoid   . Hypertension    Past Surgical History:  Procedure Laterality Date  . COLONOSCOPY  2013  . EYELID LACERATION REPAIR  2006  . TUBAL LIGATION  1995   Family History  Problem Relation Age of Onset  . Hyperlipidemia Mother   . Diabetes Mother   . Hypertension Mother   . Hyperlipidemia Father   . Diabetes Father   . Hypertension Father   . Mental illness Maternal Grandmother   . Mental illness Maternal Grandfather   . Mental illness Paternal Grandmother   . Mental illness Paternal Grandfather    Social History   Social History  . Marital status: Married    Spouse name: N/A  . Number of children: N/A  . Years of education: N/A   Social History Main Topics  . Smoking status: Never Smoker  . Smokeless tobacco: Never Used  . Alcohol use 1.2 oz/week    2 Standard drinks or equivalent per week     Comment: ocassionally  . Drug use: No  . Sexual activity: Not Asked   Other Topics Concern  . None   Social History Narrative  . None    Outpatient Encounter Prescriptions as of 09/21/2016  Medication Sig  . acetaminophen (TYLENOL) 325 MG tablet Take 650 mg by mouth every 6 (six) hours as needed for pain.  Marland Kitchen aspirin 81 MG tablet Take 81 mg by mouth daily.  . fluticasone (FLONASE) 50 MCG/ACT nasal spray Place 2 sprays into both nostrils daily. Hold until pt request  . loratadine  (CLARITIN) 10 MG tablet Take 10 mg by mouth daily.  . sertraline (ZOLOFT) 50 MG tablet Take 1 tablet (50 mg total) by mouth daily.  . [DISCONTINUED] sertraline (ZOLOFT) 50 MG tablet Take 1 tablet (50 mg total) by mouth daily.  . [DISCONTINUED] albuterol (PROVENTIL HFA;VENTOLIN HFA) 108 (90 BASE) MCG/ACT inhaler Inhale 2 puffs into the lungs every 6 (six) hours as needed.  . [DISCONTINUED] montelukast (SINGULAIR) 10 MG tablet TAKE ONE (1) TABLET EACH DAY   No facility-administered encounter medications on file as of 09/21/2016.     Review of Systems  Constitutional: Negative for appetite change.       Has gained weight.   HENT: Negative for congestion and sinus pressure.   Respiratory: Negative for cough, chest tightness and shortness of breath.   Cardiovascular: Negative for chest pain, palpitations and leg swelling.  Gastrointestinal: Negative for abdominal pain, diarrhea, nausea and vomiting.  Genitourinary: Negative for difficulty urinating and dysuria.  Musculoskeletal: Negative for back pain and joint swelling.  Skin: Negative for color change and rash.  Neurological: Negative for dizziness and headaches.  Psychiatric/Behavioral: Negative for agitation and dysphoric mood.       Objective:  Blood pressure rechecked by me:  122/82  Physical Exam  Constitutional: She appears well-developed and well-nourished. No distress.  HENT:  Nose: Nose normal.  Mouth/Throat: Oropharynx is clear and moist.  Neck: Neck supple. No thyromegaly present.  Cardiovascular: Normal rate and regular rhythm.   Pulmonary/Chest: Breath sounds normal. No respiratory distress. She has no wheezes.  Abdominal: Soft. Bowel sounds are normal. There is no tenderness.  Musculoskeletal: She exhibits no edema or tenderness.  Lymphadenopathy:    She has no cervical adenopathy.  Skin: No rash noted. No erythema.  Psychiatric: She has a normal mood and affect. Her behavior is normal.    BP 110/68 (BP  Location: Left Arm, Patient Position: Sitting, Cuff Size: Large)   Pulse 71   Temp 98.8 F (37.1 C) (Oral)   Resp 16   Ht 5\' 1"  (1.549 m)   Wt 183 lb 12.8 oz (83.4 kg)   LMP 07/16/2009   SpO2 98%   BMI 34.73 kg/m  Wt Readings from Last 3 Encounters:  09/21/16 183 lb 12.8 oz (83.4 kg)  05/20/16 174 lb 6.4 oz (79.1 kg)  03/10/16 170 lb 8 oz (77.3 kg)     Lab Results  Component Value Date   WBC 4.8 05/17/2016   HGB 13.6 05/19/2014   HCT 39.5 05/17/2016   PLT 306 05/17/2016   GLUCOSE 96 09/20/2016   CHOL 211 (H) 09/20/2016   TRIG 146 09/20/2016   HDL 56 09/20/2016   LDLDIRECT 158.7 05/19/2014   LDLCALC 126 (H) 09/20/2016   ALT 11 09/20/2016   AST 16 09/20/2016   NA 142 09/20/2016   K 4.3 09/20/2016   CL 100 09/20/2016   CREATININE 0.53 (L) 09/20/2016   BUN 10 09/20/2016   CO2 24 09/20/2016   TSH 3.270 05/17/2016   HGBA1C 5.3 09/20/2016    Mm Diag Breast Tomo Uni Right  Result Date: 01/11/2016 CLINICAL DATA:  Recall from screening for possible mass 12 o'clock right breast EXAM: 2D DIGITAL DIAGNOSTIC UNILATERAL RIGHT MAMMOGRAM WITH CAD AND ADJUNCT TOMO COMPARISON:  Previous exam(s). ACR Breast Density Category b: There are scattered areas of fibroglandular density. FINDINGS: No abnormalities on the current examination. Mammographic images were processed with CAD. IMPRESSION: Negative RECOMMENDATION: Screening mammogram in 1 year I have discussed the findings and recommendations with the patient. Results were also provided in writing at the conclusion of the visit. If applicable, a reminder letter will be sent to the patient regarding the next appointment. BI-RADS CATEGORY  1: Negative. Electronically Signed   By: Skipper Cliche M.D.   On: 01/11/2016 15:15       Assessment & Plan:   Problem List Items Addressed This Visit    Hypercholesterolemia    Low cholesterol diet and exercise.  Recent cholesterol improved.  LDL 126 and triglycerides 146.  Follow.         Hypertension    Blood pressure under good control.  Continue same medication regimen.  Follow pressures.  Follow metabolic panel.        Stress    New job.  Stress is better.  Follow.  On zoloft and doing well.            Einar Pheasant, MD

## 2016-09-23 ENCOUNTER — Ambulatory Visit: Payer: 59 | Admitting: Internal Medicine

## 2016-09-26 ENCOUNTER — Encounter: Payer: Self-pay | Admitting: Internal Medicine

## 2016-09-26 NOTE — Assessment & Plan Note (Signed)
New job.  Stress is better.  Follow.  On zoloft and doing well.

## 2016-09-26 NOTE — Assessment & Plan Note (Signed)
Blood pressure under good control.  Continue same medication regimen.  Follow pressures.  Follow metabolic panel.   

## 2016-09-26 NOTE — Assessment & Plan Note (Signed)
Low cholesterol diet and exercise.  Recent cholesterol improved.  LDL 126 and triglycerides 146.  Follow.

## 2016-10-03 DIAGNOSIS — H52223 Regular astigmatism, bilateral: Secondary | ICD-10-CM | POA: Diagnosis not present

## 2017-01-27 ENCOUNTER — Other Ambulatory Visit (INDEPENDENT_AMBULATORY_CARE_PROVIDER_SITE_OTHER): Payer: 59

## 2017-01-27 DIAGNOSIS — E7800 Pure hypercholesterolemia, unspecified: Secondary | ICD-10-CM | POA: Diagnosis not present

## 2017-01-27 DIAGNOSIS — I10 Essential (primary) hypertension: Secondary | ICD-10-CM | POA: Diagnosis not present

## 2017-01-27 LAB — HEPATIC FUNCTION PANEL
ALK PHOS: 75 U/L (ref 39–117)
ALT: 9 U/L (ref 0–35)
AST: 12 U/L (ref 0–37)
Albumin: 4.3 g/dL (ref 3.5–5.2)
BILIRUBIN DIRECT: 0 mg/dL (ref 0.0–0.3)
BILIRUBIN TOTAL: 0.4 mg/dL (ref 0.2–1.2)
TOTAL PROTEIN: 6.9 g/dL (ref 6.0–8.3)

## 2017-01-27 LAB — HEMOGLOBIN A1C: HEMOGLOBIN A1C: 5.2 % (ref 4.6–6.5)

## 2017-01-27 LAB — LIPID PANEL
CHOL/HDL RATIO: 4
Cholesterol: 226 mg/dL — ABNORMAL HIGH (ref 0–200)
HDL: 51 mg/dL (ref >39.00)
LDL CALC: 137 mg/dL — AB (ref 0–99)
NONHDL: 175.47
Triglycerides: 193 mg/dL — ABNORMAL HIGH (ref 0.0–149.0)
VLDL: 38.6 mg/dL (ref 0.0–40.0)

## 2017-01-27 LAB — BASIC METABOLIC PANEL
BUN: 14 mg/dL (ref 6–23)
CALCIUM: 9.5 mg/dL (ref 8.4–10.5)
CO2: 30 mEq/L (ref 19–32)
Chloride: 104 mEq/L (ref 96–112)
Creatinine, Ser: 0.67 mg/dL (ref 0.40–1.20)
GFR: 95.94 mL/min (ref >60.00)
GLUCOSE: 104 mg/dL — AB (ref 70–99)
POTASSIUM: 4.9 meq/L (ref 3.5–5.1)
SODIUM: 140 meq/L (ref 135–145)

## 2017-01-30 ENCOUNTER — Ambulatory Visit (INDEPENDENT_AMBULATORY_CARE_PROVIDER_SITE_OTHER): Payer: 59

## 2017-01-30 ENCOUNTER — Ambulatory Visit (INDEPENDENT_AMBULATORY_CARE_PROVIDER_SITE_OTHER): Payer: 59 | Admitting: Internal Medicine

## 2017-01-30 ENCOUNTER — Encounter: Payer: Self-pay | Admitting: Internal Medicine

## 2017-01-30 VITALS — BP 118/64 | HR 78 | Temp 98.6°F | Resp 12 | Ht 61.5 in | Wt 189.8 lb

## 2017-01-30 DIAGNOSIS — Z1239 Encounter for other screening for malignant neoplasm of breast: Secondary | ICD-10-CM

## 2017-01-30 DIAGNOSIS — E7800 Pure hypercholesterolemia, unspecified: Secondary | ICD-10-CM

## 2017-01-30 DIAGNOSIS — I10 Essential (primary) hypertension: Secondary | ICD-10-CM

## 2017-01-30 DIAGNOSIS — Z860100 Personal history of colon polyps, unspecified: Secondary | ICD-10-CM

## 2017-01-30 DIAGNOSIS — F439 Reaction to severe stress, unspecified: Secondary | ICD-10-CM

## 2017-01-30 DIAGNOSIS — Z1231 Encounter for screening mammogram for malignant neoplasm of breast: Secondary | ICD-10-CM

## 2017-01-30 DIAGNOSIS — M25512 Pain in left shoulder: Secondary | ICD-10-CM

## 2017-01-30 DIAGNOSIS — M799 Soft tissue disorder, unspecified: Secondary | ICD-10-CM | POA: Diagnosis not present

## 2017-01-30 DIAGNOSIS — Z6835 Body mass index (BMI) 35.0-35.9, adult: Secondary | ICD-10-CM | POA: Diagnosis not present

## 2017-01-30 DIAGNOSIS — R739 Hyperglycemia, unspecified: Secondary | ICD-10-CM | POA: Diagnosis not present

## 2017-01-30 DIAGNOSIS — Z1211 Encounter for screening for malignant neoplasm of colon: Secondary | ICD-10-CM | POA: Diagnosis not present

## 2017-01-30 DIAGNOSIS — Z0000 Encounter for general adult medical examination without abnormal findings: Secondary | ICD-10-CM

## 2017-01-30 DIAGNOSIS — M7989 Other specified soft tissue disorders: Secondary | ICD-10-CM

## 2017-01-30 MED ORDER — SERTRALINE HCL 50 MG PO TABS: 50.0000 mg | ORAL_TABLET | Freq: Every day | ORAL | 1 refills | Status: DC

## 2017-01-30 NOTE — Assessment & Plan Note (Addendum)
Physical today 01/30/17.  Mammogram for 2017 as outlined.  Recommended f/u in one year.  Scheduled.  PAP 11/24/14 - negative with negative HPV.  Colonoscopy overdue.  Refer to GI.  Last 2012.

## 2017-01-30 NOTE — Progress Notes (Signed)
Pre-visit discussion using our clinic review tool. No additional management support is needed unless otherwise documented below in the visit note.  

## 2017-01-30 NOTE — Progress Notes (Signed)
Patient ID: Ashley Waller, female   DOB: 30-Mar-1961, 56 y.o.   MRN: [id number]   Subjective:    Patient ID: Ashley Waller, female    DOB: 1960-12-14, 56 y.o.   MRN: [id number]  HPI  Patient with past history of hypertension and hypercholesterolemia. She comes in today to follow up on these issues as well as for a complete physical exam.  She reports she is doing relatively well.  Discussed her recent lab results.  Triglycerides and bad cholesterol increased.  Discussed diet and exercise.  No chest pain.  No sob.  No acid reflux.  No abdominal pain.  Bowels moving.  Working at her new job.  Appears to be going well. She enjoys her job.  She is concerned regarding persistent "knots" on her legs - mostly thighs.  Left leg worse.  States is causing her pain and discomfort.  Request further evaluation.  Overdue colonoscopy.  Left shoulder pain as outlined.  Hurts when reaches posterior.  No known injury.  Present for 6 months.     Past Medical History:  Diagnosis Date  . Arthritis   . Asthma   . Hematuria   . Hemorrhoid   . Hypertension    Past Surgical History:  Procedure Laterality Date  . COLONOSCOPY  2013  . EYELID LACERATION REPAIR  2006  . TUBAL LIGATION  1995   Family History  Problem Relation Age of Onset  . Hyperlipidemia Mother   . Diabetes Mother   . Hypertension Mother   . Hyperlipidemia Father   . Diabetes Father   . Hypertension Father   . Mental illness Maternal Grandmother   . Mental illness Maternal Grandfather   . Mental illness Paternal Grandmother   . Mental illness Paternal Grandfather    Social History   Social History  . Marital status: Married    Spouse name: N/A  . Number of children: N/A  . Years of education: N/A   Social History Main Topics  . Smoking status: Never Smoker  . Smokeless tobacco: Never Used  . Alcohol use 1.2 oz/week    2 Standard drinks or equivalent per week     Comment: ocassionally  . Drug use: No  . Sexual activity: Not Asked    Other Topics Concern  . None   Social History Narrative  . None    Outpatient Encounter Prescriptions as of 01/30/2017  Medication Sig  . acetaminophen (TYLENOL) 325 MG tablet Take 650 mg by mouth every 6 (six) hours as needed for pain.  Marland Kitchen aspirin 81 MG tablet Take 81 mg by mouth daily.  . fluticasone (FLONASE) 50 MCG/ACT nasal spray Place 2 sprays into both nostrils daily. Hold until pt request  . loratadine (CLARITIN) 10 MG tablet Take 10 mg by mouth daily.  . sertraline (ZOLOFT) 50 MG tablet Take 1 tablet (50 mg total) by mouth daily.  . [DISCONTINUED] sertraline (ZOLOFT) 50 MG tablet Take 1 tablet (50 mg total) by mouth daily.   No facility-administered encounter medications on file as of 01/30/2017.     Review of Systems  Constitutional: Negative for appetite change and unexpected weight change.  HENT: Negative for congestion and sinus pressure.   Eyes: Negative for pain and visual disturbance.  Respiratory: Negative for cough, chest tightness and shortness of breath.   Cardiovascular: Negative for chest pain, palpitations and leg swelling.  Gastrointestinal: Negative for abdominal pain, diarrhea, nausea and vomiting.  Genitourinary: Negative for difficulty urinating and dysuria.  Musculoskeletal: Negative for back  pain and joint swelling.       "knots" on her legs as outlined.  Discomfort as outlined.    Skin: Negative for color change and rash.  Neurological: Negative for dizziness, light-headedness and headaches.  Hematological: Negative for adenopathy. Does not bruise/bleed easily.  Psychiatric/Behavioral: Negative for agitation and dysphoric mood.       Objective:    Physical Exam  Constitutional: She is oriented to person, place, and time. She appears well-developed and well-nourished. No distress.  HENT:  Nose: Nose normal.  Mouth/Throat: Oropharynx is clear and moist.  Eyes: Right eye exhibits no discharge. Left eye exhibits no discharge. No scleral icterus.   Neck: Neck supple. No thyromegaly present.  Cardiovascular: Normal rate and regular rhythm.   Pulmonary/Chest: Breath sounds normal. No accessory muscle usage. No tachypnea. No respiratory distress. She has no decreased breath sounds. She has no wheezes. She has no rhonchi. Right breast exhibits no inverted nipple, no mass, no nipple discharge and no tenderness (no axillary adenopathy). Left breast exhibits no inverted nipple, no mass, no nipple discharge and no tenderness (no axilarry adenopathy).  Abdominal: Soft. Bowel sounds are normal. There is no tenderness.  Musculoskeletal: She exhibits no edema or tenderness.  Increased pain left shoulder with abduction above 90 degrees.  Also pain with reaching posteriorly.  Also, palpable soft tissue masses - bilateral thighs.  Increased discomfort - left.  No increased redness.    Lymphadenopathy:    She has no cervical adenopathy.  Neurological: She is alert and oriented to person, place, and time.  Skin: Skin is warm. No rash noted. No erythema.  Psychiatric: She has a normal mood and affect. Her behavior is normal.    BP 118/64 (BP Location: Left Arm, Patient Position: Sitting, Cuff Size: Normal)   Pulse 78   Temp 98.6 F (37 C) (Oral)   Resp 12   Ht 5' 1.5" (1.562 m)   Wt 189 lb 12.8 oz (86.1 kg)   LMP 07/16/2009   SpO2 98%   BMI 35.28 kg/m  Wt Readings from Last 3 Encounters:  01/30/17 189 lb 12.8 oz (86.1 kg)  09/21/16 183 lb 12.8 oz (83.4 kg)  05/20/16 174 lb 6.4 oz (79.1 kg)     Lab Results  Component Value Date   WBC 4.8 05/17/2016   HGB 13.2 05/17/2016   HCT 39.5 05/17/2016   PLT 306 05/17/2016   GLUCOSE 104 (H) 01/27/2017   CHOL 226 (H) 01/27/2017   TRIG 193.0 (H) 01/27/2017   HDL 51.00 01/27/2017   LDLDIRECT 158.7 05/19/2014   LDLCALC 137 (H) 01/27/2017   ALT 9 01/27/2017   AST 12 01/27/2017   NA 140 01/27/2017   K 4.9 01/27/2017   CL 104 01/27/2017   CREATININE 0.67 01/27/2017   BUN 14 01/27/2017   CO2 30  01/27/2017   TSH 3.270 05/17/2016   HGBA1C 5.2 01/27/2017    Mm Diag Breast Tomo Uni Right  Result Date: 01/11/2016 CLINICAL DATA:  Recall from screening for possible mass 12 o'clock right breast EXAM: 2D DIGITAL DIAGNOSTIC UNILATERAL RIGHT MAMMOGRAM WITH CAD AND ADJUNCT TOMO COMPARISON:  Previous exam(s). ACR Breast Density Category b: There are scattered areas of fibroglandular density. FINDINGS: No abnormalities on the current examination. Mammographic images were processed with CAD. IMPRESSION: Negative RECOMMENDATION: Screening mammogram in 1 year I have discussed the findings and recommendations with the patient. Results were also provided in writing at the conclusion of the visit. If applicable, a reminder letter will be  sent to the patient regarding the next appointment. BI-RADS CATEGORY  1: Negative. Electronically Signed   By: Skipper Cliche M.D.   On: 01/11/2016 15:15       Assessment & Plan:   Problem List Items Addressed This Visit    Adult BMI 35.0-35.9 kg/sq m    Discussed diet and exercise as outlined.  Plans to get more serious.        Health care maintenance    Physical today 01/30/17.  Mammogram for 2017 as outlined.  Recommended f/u in one year.  Scheduled.  PAP 11/24/14 - negative with negative HPV.  Colonoscopy overdue.  Refer to GI.  Last 2012.       History of colonic polyps    Last colonoscopy 2012.  Was due 2017.  Was scheduled and she had to cancel.  Refer to GI for colon cancer screening.        Hypercholesterolemia    Low cholesterol diet and exercise.  Discussed with her today.  Discussed increase.  Follow lipid panel.  Hold on medication.        Relevant Orders   Lipid panel   Hepatic function panel   Hypertension    Blood pressure under good control.  Continue same medication regimen.  Follow pressures.  Follow metabolic panel.        Relevant Orders   CBC with Differential/Platelet   TSH   Basic metabolic panel   Stress    Stress is better.   Doing well on current regimen.  Follow.         Other Visit Diagnoses    Screening for breast cancer    -  Primary   Relevant Orders   MM DIGITAL SCREENING BILATERAL   Colon cancer screening       Relevant Orders   Ambulatory referral to Gastroenterology   Soft tissue mass       Persistent soft tissue masses - thighs.  Tender.  increasing pain.  she request further evaluation.  request referral to surgery.     Relevant Orders   Ambulatory referral to General Surgery   Left shoulder pain, unspecified chronicity       persistent.  check shoulder xray.     Relevant Orders   DG Shoulder Left (Completed)   Hyperglycemia       Relevant Orders   Hemoglobin A1c       Einar Pheasant, MD

## 2017-01-31 ENCOUNTER — Encounter: Payer: Self-pay | Admitting: Internal Medicine

## 2017-01-31 DIAGNOSIS — Z6834 Body mass index (BMI) 34.0-34.9, adult: Secondary | ICD-10-CM | POA: Insufficient documentation

## 2017-01-31 DIAGNOSIS — Z6835 Body mass index (BMI) 35.0-35.9, adult: Secondary | ICD-10-CM

## 2017-01-31 HISTORY — DX: Body mass index (BMI) 35.0-35.9, adult: Z68.35

## 2017-01-31 NOTE — Assessment & Plan Note (Signed)
Stress is better.  Doing well on current regimen.  Follow.

## 2017-01-31 NOTE — Assessment & Plan Note (Signed)
Blood pressure under good control.  Continue same medication regimen.  Follow pressures.  Follow metabolic panel.   

## 2017-01-31 NOTE — Assessment & Plan Note (Signed)
Discussed diet and exercise as outlined.  Plans to get more serious.

## 2017-01-31 NOTE — Assessment & Plan Note (Signed)
Last colonoscopy 2012.  Was due 2017.  Was scheduled and she had to cancel.  Refer to GI for colon cancer screening.

## 2017-01-31 NOTE — Assessment & Plan Note (Signed)
Low cholesterol diet and exercise.  Discussed with her today.  Discussed increase.  Follow lipid panel.  Hold on medication.

## 2017-02-09 ENCOUNTER — Encounter: Payer: Self-pay | Admitting: Surgery

## 2017-02-09 ENCOUNTER — Ambulatory Visit (INDEPENDENT_AMBULATORY_CARE_PROVIDER_SITE_OTHER): Payer: 59 | Admitting: Surgery

## 2017-02-09 VITALS — BP 121/80 | HR 96 | Temp 98.6°F | Ht 61.5 in | Wt 192.0 lb

## 2017-02-09 DIAGNOSIS — M7989 Other specified soft tissue disorders: Secondary | ICD-10-CM | POA: Diagnosis not present

## 2017-02-09 NOTE — Progress Notes (Signed)
Patient ID: Ashley Waller, female   DOB: 1961-03-09, 56 y.o.   MRN: [id number]  HPI Ashley Waller is a 56 y.o. female seen for multiple left-sided nodules. She reports that they have been present for more than 2 years with no change in size. She is reports that there is some discomfort whenever she gets dressed or when she  bends her left hip. She reports some tightness and discuss 4 on the left knee. No other specific aggravating factors. No personal history of cancer. No images are available. labs reviewed, nml cbc and bmp.  HPI  Past Medical History:  Diagnosis Date  . Abnormal mammogram 10/15/2012  . Adult BMI 35.0-35.9 kg/sq m 01/31/2017  . Arthritis   . Asthma   . Chest pain 03/13/2016  . Health care maintenance 11/30/2014   Mammogram 12/22/15 - Birads 0.  Recommended f/u right breat mammogram.  Follow up right breast mammogram 01/11/16 - Birads I.  Recommended f/u mammogram in one year.     . Hematuria   . Hemorrhoid   . History of colonic polyps 02/27/2015  . Hypercholesterolemia 07/16/2012  . Hypertension   . Menopausal syndrome 07/22/2012  . Plantar fasciitis of left foot 07/15/2013  . Stress 05/17/2014    Past Surgical History:  Procedure Laterality Date  . COLONOSCOPY  2013  . EYELID LACERATION REPAIR  2006  . TUBAL LIGATION  1995    Family History  Problem Relation Age of Onset  . Hyperlipidemia Mother   . Diabetes Mother   . Hypertension Mother   . Hyperlipidemia Father   . Diabetes Father   . Hypertension Father   . Mental illness Maternal Grandmother   . Mental illness Maternal Grandfather   . Mental illness Paternal Grandmother   . Mental illness Paternal Grandfather     Social History Social History  Substance Use Topics  . Smoking status: Never Smoker  . Smokeless tobacco: Never Used  . Alcohol use 1.2 oz/week    2 Standard drinks or equivalent per week     Comment: ocassionally    Allergies  Allergen Reactions  . Penicillins     Current Outpatient  Prescriptions  Medication Sig Dispense Refill  . acetaminophen (TYLENOL) 325 MG tablet Take 650 mg by mouth every 6 (six) hours as needed for pain.    Marland Kitchen aspirin 81 MG tablet Take 81 mg by mouth daily.    . fluticasone (FLONASE) 50 MCG/ACT nasal spray Place 2 sprays into both nostrils daily. Hold until pt request 48 g 1  . loratadine (CLARITIN) 10 MG tablet Take 10 mg by mouth daily.    . sertraline (ZOLOFT) 50 MG tablet Take 1 tablet (50 mg total) by mouth daily. 90 tablet 1   No current facility-administered medications for this visit.      Review of Systems Full ROS  was asked and was negative except for the information on the HPI  Physical Exam Blood pressure 121/80, pulse 96, temperature 98.6 F (37 C), temperature source Oral, height 5' 1.5" (1.562 m), weight 87.1 kg (192 lb), last menstrual period 07/16/2009. CONSTITUTIONAL: NAD, obese EYES: Pupils are equal Sclera are non-icteric. EARS, NOSE, MOUTH AND THROAT: The oral mucosa is pink and moist. Hearing is intact to voice. NEck: no masses, no JVD, supple LYMPH NODES:  Lymph nodes in the neck, axilla and inguinal region are normal. GI: The abdomen is  soft, nontender, and nondistended. There are no palpable masses. There is no hepatosplenomegaly. There are normal bowel sounds  in all quadrants. GU: Rectal deferred.   MUSCULOSKELETAL: Normal muscle strength and tone. No cyanosis or edema.   SKIN: Small nodule 1 cms on anterior left thigh, non tender NEUROLOGIC: Motor and sensation is grossly normal. Cranial nerves are grossly intact. PSYCH:  Oriented to person, place and time. Affect is normal.  Data Reviewed I have personally reviewed the patient's imaging, laboratory findings and medical records.    Assessment/Plan 56 year old female with left-sided nodules consistent with benign cyst. Discussed with the patient in detail about options of further workup including imaging versus observation. I feel pretty confident that there is  no evidence of malignancy and that clinical follow-up is a very reasonable alternative. She will continue to keep an eye on those lesions and if they become more symptomatic or the crow excision will be recommended.   Caroleen Hamman, MD FACS General Surgeon 02/09/2017, 1:48 PM

## 2017-02-09 NOTE — Patient Instructions (Signed)
Please give [place] a call in case you have any questions or concerns. Please let us know if you notice that the cysts grow.

## 2017-02-10 ENCOUNTER — Ambulatory Visit
Admission: RE | Admit: 2017-02-10 | Discharge: 2017-02-10 | Disposition: A | Discharge status: Home / Self Care | Payer: 59 | Source: Ambulatory Visit | Attending: Internal Medicine | Admitting: Internal Medicine

## 2017-02-10 DIAGNOSIS — Z1231 Encounter for screening mammogram for malignant neoplasm of breast: Secondary | ICD-10-CM | POA: Insufficient documentation

## 2017-02-10 DIAGNOSIS — Z1239 Encounter for other screening for malignant neoplasm of breast: Secondary | ICD-10-CM

## 2017-05-01 DIAGNOSIS — Z86010 Personal history of colonic polyps: Secondary | ICD-10-CM | POA: Diagnosis not present

## 2017-05-26 DIAGNOSIS — Z86010 Personal history of colonic polyps: Secondary | ICD-10-CM | POA: Diagnosis not present

## 2017-05-26 DIAGNOSIS — D125 Benign neoplasm of sigmoid colon: Secondary | ICD-10-CM | POA: Diagnosis not present

## 2017-05-26 DIAGNOSIS — K635 Polyp of colon: Secondary | ICD-10-CM | POA: Diagnosis not present

## 2017-05-26 DIAGNOSIS — D123 Benign neoplasm of transverse colon: Secondary | ICD-10-CM | POA: Diagnosis not present

## 2017-05-26 LAB — HM COLONOSCOPY

## 2017-06-01 ENCOUNTER — Other Ambulatory Visit (INDEPENDENT_AMBULATORY_CARE_PROVIDER_SITE_OTHER): Payer: 59

## 2017-06-01 DIAGNOSIS — I10 Essential (primary) hypertension: Secondary | ICD-10-CM

## 2017-06-01 DIAGNOSIS — E7800 Pure hypercholesterolemia, unspecified: Secondary | ICD-10-CM

## 2017-06-01 DIAGNOSIS — R739 Hyperglycemia, unspecified: Secondary | ICD-10-CM | POA: Diagnosis not present

## 2017-06-01 LAB — CBC WITH DIFFERENTIAL/PLATELET
Basophils Absolute: 0.1 10*3/uL (ref 0.0–0.1)
Basophils Relative: 1.1 % (ref 0.0–3.0)
EOS PCT: 1.7 % (ref 0.0–5.0)
Eosinophils Absolute: 0.1 10*3/uL (ref 0.0–0.7)
HCT: 39.8 % (ref 36.0–46.0)
Hemoglobin: 13 g/dL (ref 12.0–15.0)
LYMPHS ABS: 1.9 10*3/uL (ref 0.7–4.0)
Lymphocytes Relative: 36.7 % (ref 12.0–46.0)
MCHC: 32.7 g/dL (ref 30.0–36.0)
MCV: 88.4 fl (ref 78.0–100.0)
MONO ABS: 0.4 10*3/uL (ref 0.1–1.0)
Monocytes Relative: 7.2 % (ref 3.0–12.0)
NEUTROS PCT: 53.3 % (ref 43.0–77.0)
Neutro Abs: 2.8 10*3/uL (ref 1.4–7.7)
Platelets: 337 10*3/uL (ref 150.0–400.0)
RBC: 4.5 Mil/uL (ref 3.87–5.11)
RDW: 12.9 % (ref 11.5–15.5)
WBC: 5.2 10*3/uL (ref 4.0–10.5)

## 2017-06-01 LAB — BASIC METABOLIC PANEL
BUN: 9 mg/dL (ref 6–23)
CALCIUM: 9.4 mg/dL (ref 8.4–10.5)
CO2: 31 meq/L (ref 19–32)
CREATININE: 0.59 mg/dL (ref 0.40–1.20)
Chloride: 102 mEq/L (ref 96–112)
GFR: 110.98 mL/min (ref >60.00)
GLUCOSE: 107 mg/dL — AB (ref 70–99)
Potassium: 4.6 mEq/L (ref 3.5–5.1)
Sodium: 139 mEq/L (ref 135–145)

## 2017-06-01 LAB — LIPID PANEL
Cholesterol: 218 mg/dL — ABNORMAL HIGH (ref 0–200)
HDL: 50.2 mg/dL (ref >39.00)
NonHDL: 168.19
Total CHOL/HDL Ratio: 4
Triglycerides: 207 mg/dL — ABNORMAL HIGH (ref 0.0–149.0)
VLDL: 41.4 mg/dL — ABNORMAL HIGH (ref 0.0–40.0)

## 2017-06-01 LAB — HEPATIC FUNCTION PANEL
ALK PHOS: 75 U/L (ref 39–117)
ALT: 8 U/L (ref 0–35)
AST: 14 U/L (ref 0–37)
Albumin: 4.3 g/dL (ref 3.5–5.2)
BILIRUBIN DIRECT: 0.1 mg/dL (ref 0.0–0.3)
BILIRUBIN TOTAL: 0.4 mg/dL (ref 0.2–1.2)
Total Protein: 7.2 g/dL (ref 6.0–8.3)

## 2017-06-01 LAB — LDL CHOLESTEROL, DIRECT: LDL DIRECT: 155 mg/dL

## 2017-06-01 LAB — TSH: TSH: 3.81 u[IU]/mL (ref 0.35–4.50)

## 2017-06-01 LAB — HEMOGLOBIN A1C: Hgb A1c MFr Bld: 5.5 % (ref 4.6–6.5)

## 2017-06-02 ENCOUNTER — Other Ambulatory Visit: Payer: Self-pay | Admitting: Internal Medicine

## 2017-06-02 DIAGNOSIS — E876 Hypokalemia: Secondary | ICD-10-CM

## 2017-06-02 NOTE — Progress Notes (Signed)
Order placed for f/u potassium.  

## 2017-06-05 ENCOUNTER — Encounter: Payer: Self-pay | Admitting: Internal Medicine

## 2017-06-05 ENCOUNTER — Ambulatory Visit (INDEPENDENT_AMBULATORY_CARE_PROVIDER_SITE_OTHER): Payer: 59 | Admitting: Internal Medicine

## 2017-06-05 DIAGNOSIS — R739 Hyperglycemia, unspecified: Secondary | ICD-10-CM | POA: Diagnosis not present

## 2017-06-05 DIAGNOSIS — Z6835 Body mass index (BMI) 35.0-35.9, adult: Secondary | ICD-10-CM | POA: Diagnosis not present

## 2017-06-05 DIAGNOSIS — E7800 Pure hypercholesterolemia, unspecified: Secondary | ICD-10-CM | POA: Diagnosis not present

## 2017-06-05 DIAGNOSIS — F439 Reaction to severe stress, unspecified: Secondary | ICD-10-CM

## 2017-06-05 DIAGNOSIS — I10 Essential (primary) hypertension: Secondary | ICD-10-CM | POA: Diagnosis not present

## 2017-06-05 MED ORDER — SERTRALINE HCL 50 MG PO TABS: 50.0000 mg | ORAL_TABLET | Freq: Every day | ORAL | 1 refills | Status: DC

## 2017-06-05 NOTE — Progress Notes (Signed)
Patient ID: Ashley Waller, female   DOB: 09-Mar-1961, 56 y.o.   MRN: [id number]   Subjective:    Patient ID: Ashley Waller, female    DOB: 04-Feb-1961, 56 y.o.   MRN: [id number]  HPI  Patient here for a scheduled follow up.  She is doing well.  Job is going well.  Stress is better.  Not exercising as much.  Plans to start exercising more.  No chest pain.  No sob.  No acid reflux.  No abdominal pain.  Bowels moving.  No urine change.  States her blood pressure is averaging 120/80.  Discussed labs.  Discussed diet and exercise, specifically low carb diet.     Past Medical History:  Diagnosis Date  . Abnormal mammogram 10/15/2012  . Adult BMI 35.0-35.9 kg/sq m 01/31/2017  . Arthritis   . Asthma   . Chest pain 03/13/2016  . Health care maintenance 11/30/2014   Mammogram 12/22/15 - Birads 0.  Recommended f/u right breat mammogram.  Follow up right breast mammogram 01/11/16 - Birads I.  Recommended f/u mammogram in one year.     . Hematuria   . Hemorrhoid   . History of colonic polyps 02/27/2015  . Hypercholesterolemia 07/16/2012  . Hypertension   . Menopausal syndrome 07/22/2012  . Plantar fasciitis of left foot 07/15/2013  . Stress 05/17/2014   Past Surgical History:  Procedure Laterality Date  . COLONOSCOPY  2013  . EYELID LACERATION REPAIR  2006  . TUBAL LIGATION  1995   Family History  Problem Relation Age of Onset  . Hyperlipidemia Mother   . Diabetes Mother   . Hypertension Mother   . Hyperlipidemia Father   . Diabetes Father   . Hypertension Father   . Mental illness Maternal Grandmother   . Mental illness Maternal Grandfather   . Mental illness Paternal Grandmother   . Mental illness Paternal Grandfather   . Breast cancer Neg Hx    Social History   Socioeconomic History  . Marital status: Married    Spouse name: None  . Number of children: None  . Years of education: None  . Highest education level: None  Social Needs  . Financial resource strain: None  . Food insecurity -  worry: None  . Food insecurity - inability: None  . Transportation needs - medical: None  . Transportation needs - non-medical: None  Occupational History  . None  Tobacco Use  . Smoking status: Never Smoker  . Smokeless tobacco: Never Used  Substance and Sexual Activity  . Alcohol use: Yes    Alcohol/week: 1.2 oz    Types: 2 Standard drinks or equivalent per week    Comment: ocassionally  . Drug use: No  . Sexual activity: None  Other Topics Concern  . None  Social History Narrative  . None    Outpatient Encounter Medications as of 06/05/2017  Medication Sig  . acetaminophen (TYLENOL) 325 MG tablet Take 650 mg by mouth every 6 (six) hours as needed for pain.  Marland Kitchen aspirin 81 MG tablet Take 81 mg by mouth daily.  . fluticasone (FLONASE) 50 MCG/ACT nasal spray Place 2 sprays into both nostrils daily. Hold until pt request  . loratadine (CLARITIN) 10 MG tablet Take 10 mg by mouth daily.  . sertraline (ZOLOFT) 50 MG tablet Take 1 tablet (50 mg total) daily by mouth.  . [DISCONTINUED] sertraline (ZOLOFT) 50 MG tablet Take 1 tablet (50 mg total) by mouth daily.   No facility-administered encounter medications on file  as of 06/05/2017.     Review of Systems  Constitutional: Negative for appetite change and unexpected weight change.       Not watching her diet.   HENT: Negative for congestion and sinus pressure.   Respiratory: Negative for cough, chest tightness and shortness of breath.   Cardiovascular: Negative for chest pain, palpitations and leg swelling.  Gastrointestinal: Negative for abdominal pain, diarrhea and nausea.  Genitourinary: Negative for difficulty urinating and dysuria.  Musculoskeletal: Negative for back pain and joint swelling.  Skin: Negative for color change and rash.  Neurological: Negative for dizziness, light-headedness and headaches.  Psychiatric/Behavioral: Negative for agitation and dysphoric mood.       Objective:     Blood pressure rechecked by  me:  120/84  Physical Exam  Constitutional: She appears well-developed and well-nourished. No distress.  HENT:  Nose: Nose normal.  Mouth/Throat: Oropharynx is clear and moist.  Neck: Neck supple. No thyromegaly present.  Cardiovascular: Normal rate and regular rhythm.  Pulmonary/Chest: Breath sounds normal. No respiratory distress. She has no wheezes.  Abdominal: Soft. Bowel sounds are normal. There is no tenderness.  Musculoskeletal: She exhibits no edema or tenderness.  Lymphadenopathy:    She has no cervical adenopathy.  Skin: No rash noted. No erythema.  Psychiatric: She has a normal mood and affect. Her behavior is normal.    BP 120/84   Pulse 81   Temp 98.6 F (37 C) (Oral)   Resp 14   Ht 5' 2"  (1.575 m)   Wt 194 lb 12.8 oz (88.4 kg)   LMP 07/16/2009   SpO2 98%   BMI 35.63 kg/m  Wt Readings from Last 3 Encounters:  06/05/17 194 lb 12.8 oz (88.4 kg)  02/09/17 192 lb (87.1 kg)  01/30/17 189 lb 12.8 oz (86.1 kg)     Lab Results  Component Value Date   WBC 5.2 06/01/2017   HGB 13.0 06/01/2017   HCT 39.8 06/01/2017   PLT 337.0 06/01/2017   GLUCOSE 107 (H) 06/01/2017   CHOL 218 (H) 06/01/2017   TRIG 207.0 (H) 06/01/2017   HDL 50.20 06/01/2017   LDLDIRECT 155.0 06/01/2017   LDLCALC 137 (H) 01/27/2017   ALT 8 06/01/2017   AST 14 06/01/2017   NA 139 06/01/2017   K 4.6 06/01/2017   CL 102 06/01/2017   CREATININE 0.59 06/01/2017   BUN 9 06/01/2017   CO2 31 06/01/2017   TSH 3.81 06/01/2017   HGBA1C 5.5 06/01/2017    Mm Screening Breast Tomo Bilateral  Result Date: 02/10/2017 CLINICAL DATA:  Screening. EXAM: 2D DIGITAL SCREENING BILATERAL MAMMOGRAM WITH CAD AND ADJUNCT TOMO COMPARISON:  Previous exam(s). ACR Breast Density Category b: There are scattered areas of fibroglandular density. FINDINGS: There are no findings suspicious for malignancy. Images were processed with CAD. IMPRESSION: No mammographic evidence of malignancy. A result letter of this screening  mammogram will be mailed directly to the patient. RECOMMENDATION: Screening mammogram in one year. (Code:SM-B-01Y) BI-RADS CATEGORY  1: Negative. Electronically Signed   By: Curlene Dolphin M.D.   On: 02/10/2017 14:31       Assessment & Plan:   Problem List Items Addressed This Visit    Adult BMI 35.0-35.9 kg/sq m    Diet and exercise.  Follow.        Hypercholesterolemia    Discussed cholesterol results.  Discussed diet and exercise.  Follow lipid panel.        Relevant Orders   Hepatic function panel   Lipid  panel   Hyperglycemia    Low carb diet and exercise.  Follow met b and a1c.        Relevant Orders   Hemoglobin A1c   Hypertension    Blood pressure under good control.  Continue same medication regimen.  Follow pressures.  Follow metabolic panel.        Relevant Orders   Basic metabolic panel   Stress    Doing well.  Stress is better.  Follow.           Einar Pheasant, MD

## 2017-06-07 ENCOUNTER — Encounter: Payer: Self-pay | Admitting: Internal Medicine

## 2017-06-07 DIAGNOSIS — R739 Hyperglycemia, unspecified: Secondary | ICD-10-CM | POA: Insufficient documentation

## 2017-06-07 NOTE — Assessment & Plan Note (Signed)
Discussed cholesterol results.  Discussed diet and exercise.  Follow lipid panel.

## 2017-06-07 NOTE — Assessment & Plan Note (Signed)
Low carb diet and exercise.  Follow met b and a1c.   

## 2017-06-07 NOTE — Assessment & Plan Note (Signed)
Doing well.  Stress is better.  Follow.

## 2017-06-07 NOTE — Assessment & Plan Note (Signed)
Blood pressure under good control.  Continue same medication regimen.  Follow pressures.  Follow metabolic panel.   

## 2017-06-07 NOTE — Assessment & Plan Note (Signed)
Diet and exercise.  Follow.  

## 2017-07-13 IMAGING — MR MR HEAD W/O CM
10 series · 48 of 48 positions shown · non-contrast
Comparison: None.

CLINICAL DATA: 55-year-old hypertensive female with left-sided
facial droop. Initial encounter.

EXAM:
MRI HEAD WITHOUT CONTRAST
TECHNIQUE: Multiplanar, multiecho pulse sequences of the brain and surrounding
structures were obtained without intravenous contrast.

[Series 2: T1 · sagittal · 5.0mm · 0.45mm/px · 4 of 29 slices shown (1 of 2)]
[im 1/29]
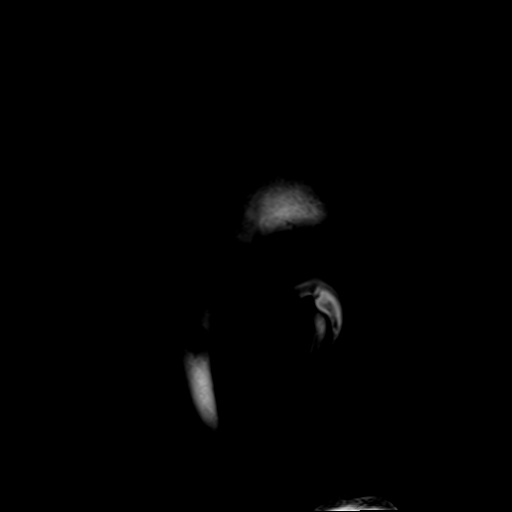
[im 10/29]
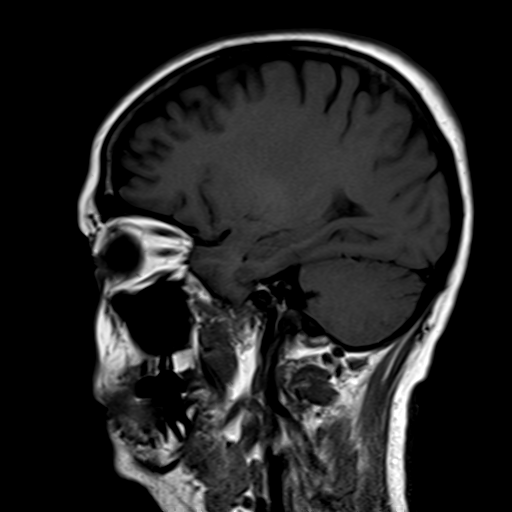
[im 19/29]
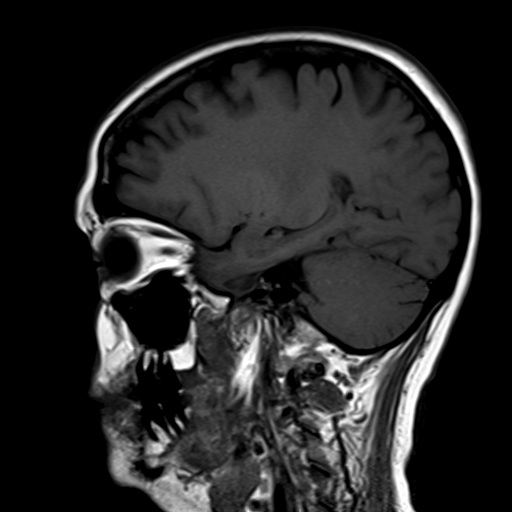
[im 29/29]
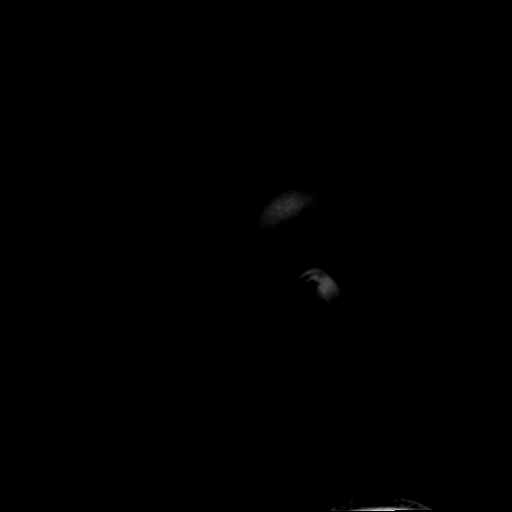

[Series 4: DWI · axial · 3.0mm · 1.80mm/px · z∈[-78,+84]mm · 6 of 54 slices shown (1 of 2)]
[im 1/54]
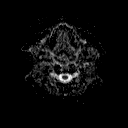
[im 11/54]
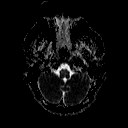
[im 22/54]
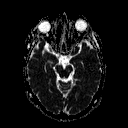
[im 32/54]
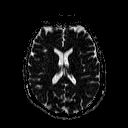
[im 43/54]
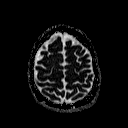
[im 54/54]
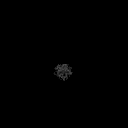

[Series 6: DWI · coronal · 3.0mm · 1.80mm/px · 6 of 49 slices shown (2 of 2)]
[im 1/49]
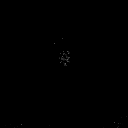
[im 10/49]
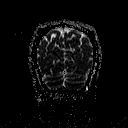
[im 20/49]
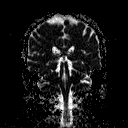
[im 29/49]
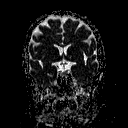
[im 39/49]
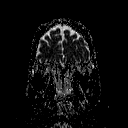
[im 49/49]
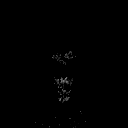

[Series 7: T2 · axial · 5.0mm · 0.60mm/px · z∈[-75,+81]mm · 3 of 25 slices shown (1 of 3)]
[im 1/25]
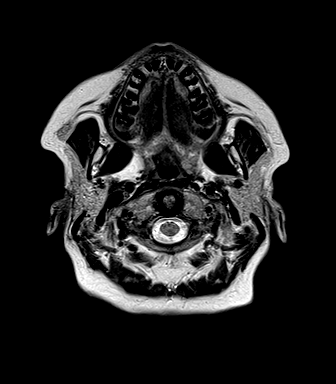
[im 13/25]
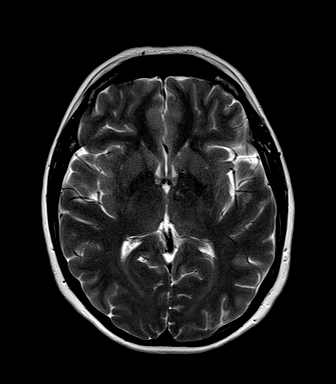
[im 25/25]
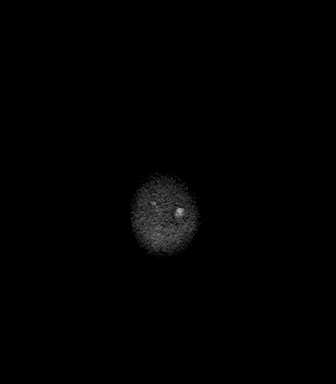

[Series 8: FLAIR · axial · 5.0mm · 0.45mm/px · z∈[-75,+81]mm · 3 of 25 slices shown]
[im 1/25]
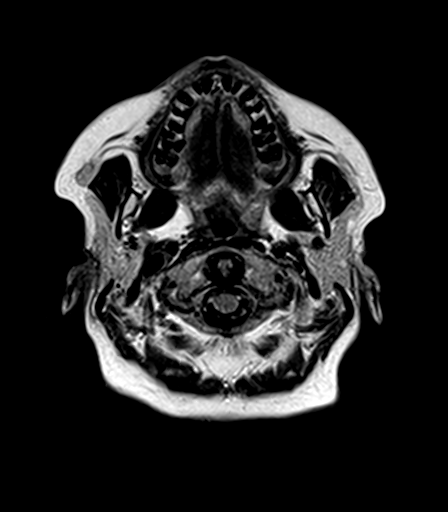
[im 13/25]
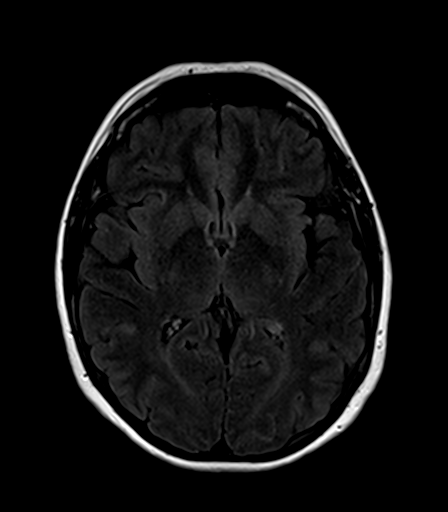
[im 25/25]
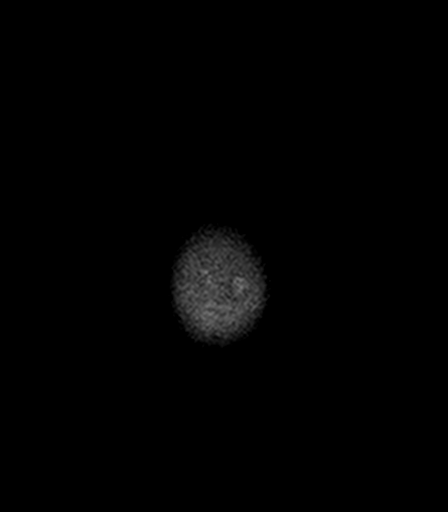

[Series 9: T2 · axial · 5.0mm · 0.45mm/px · z∈[-75,+81]mm · 3 of 25 slices shown (2 of 3)]
[im 1/25]
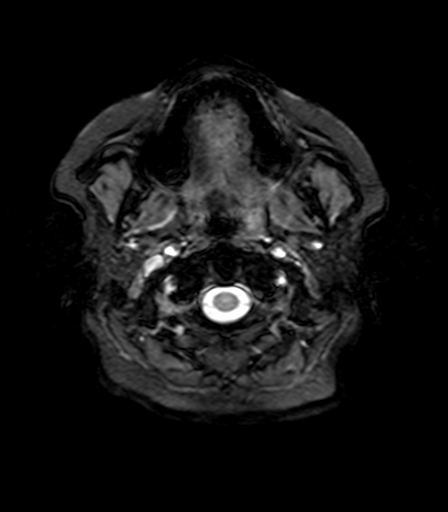
[im 13/25]
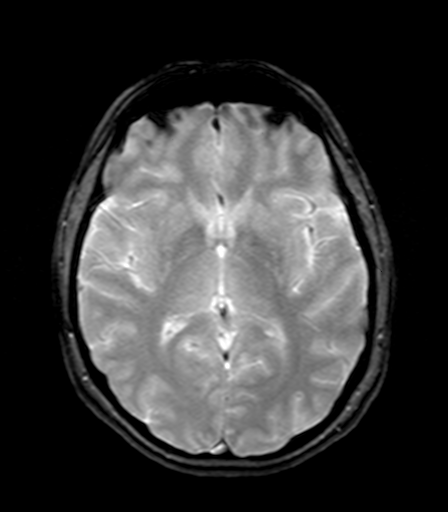
[im 25/25]
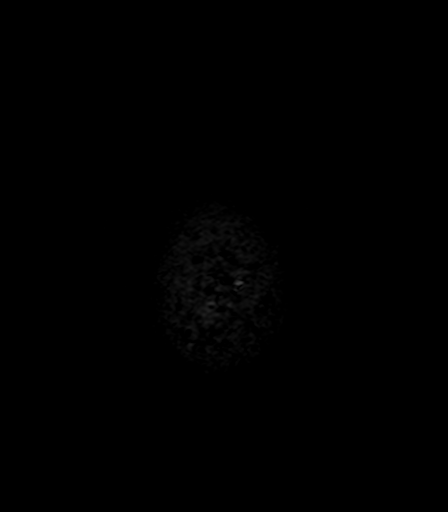

[Series 10: T1 · axial · 3.0mm · 1.00mm/px · z∈[-85,+92]mm · 7 of 60 slices shown (2 of 2)]
[im 1/60]
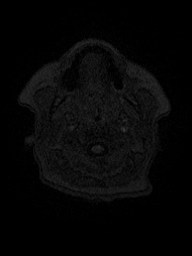
[im 10/60]
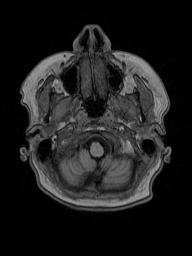
[im 20/60]
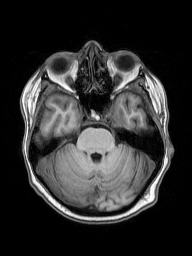
[im 30/60]
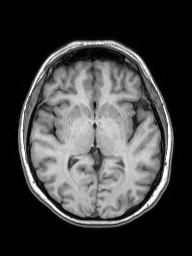
[im 40/60]
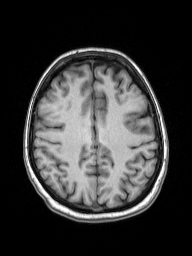
[im 50/60]
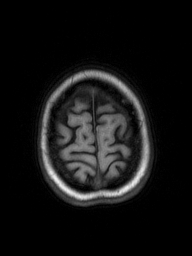
[im 60/60]
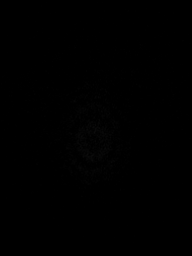

[Series 11: T2 · coronal · 5.0mm · 0.49mm/px · 3 of 29 slices shown (3 of 3)]
[im 1/29]
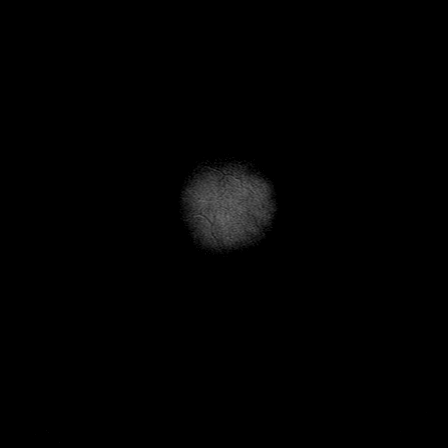
[im 15/29]
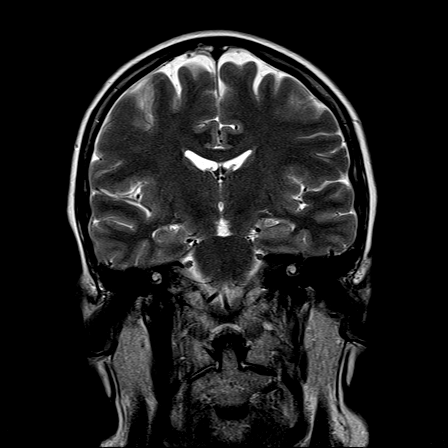
[im 29/29]
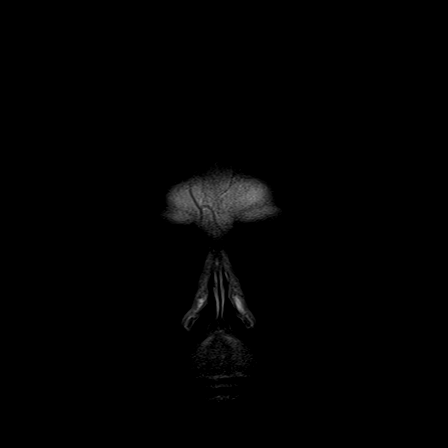

[Series 100: ax (id) · axial · 3.0mm · 1.80mm/px · z∈[-78,+84]mm · 7 of 55 slices shown]
[im 1/55]
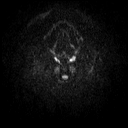
[im 10/55]
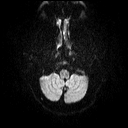
[im 19/55]
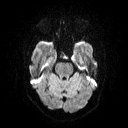
[im 28/55]
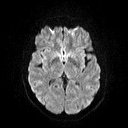
[im 37/55]
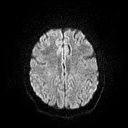
[im 46/55]
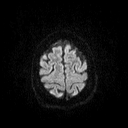
[im 55/55]
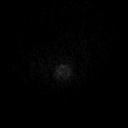

[Series 101: cor (id) · coronal · 3.0mm · 1.80mm/px · 6 of 49 slices shown]
[im 1/49]
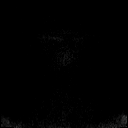
[im 10/49]
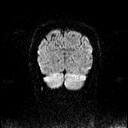
[im 20/49]
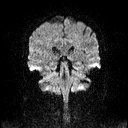
[im 29/49]
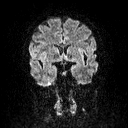
[im 39/49]
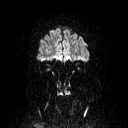
[im 49/49]
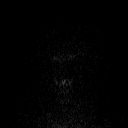

[48 of 48 positions shown; findings below may reference images not displayed]

FINDINGS: No acute infarct or intracranial hemorrhage.

No focal area of altered signal intensity to suggest demyelinating
process.

No age advanced atrophy or hydrocephalus.

No intracranial mass lesion noted on this unenhanced exam.

Major intracranial vascular structures are patent.

Cervical medullary junction, pituitary region, pineal region and
orbital structures unremarkable.

Minimal mucosal thickening ethmoid sinus air cells.
IMPRESSION: Essentially negative unenhanced MR brain as noted above.

## 2017-10-05 ENCOUNTER — Other Ambulatory Visit (INDEPENDENT_AMBULATORY_CARE_PROVIDER_SITE_OTHER): Payer: No Typology Code available for payment source

## 2017-10-05 DIAGNOSIS — I10 Essential (primary) hypertension: Secondary | ICD-10-CM

## 2017-10-05 DIAGNOSIS — R739 Hyperglycemia, unspecified: Secondary | ICD-10-CM | POA: Diagnosis not present

## 2017-10-05 DIAGNOSIS — E876 Hypokalemia: Secondary | ICD-10-CM

## 2017-10-05 DIAGNOSIS — E7800 Pure hypercholesterolemia, unspecified: Secondary | ICD-10-CM | POA: Diagnosis not present

## 2017-10-05 LAB — BASIC METABOLIC PANEL
BUN: 11 mg/dL (ref 6–23)
CHLORIDE: 101 meq/L (ref 96–112)
CO2: 30 mEq/L (ref 19–32)
Calcium: 9.7 mg/dL (ref 8.4–10.5)
Creatinine, Ser: 0.59 mg/dL (ref 0.40–1.20)
GFR: 110.84 mL/min (ref >60.00)
Glucose, Bld: 105 mg/dL — ABNORMAL HIGH (ref 70–99)
POTASSIUM: 4.4 meq/L (ref 3.5–5.1)
Sodium: 137 mEq/L (ref 135–145)

## 2017-10-05 LAB — LDL CHOLESTEROL, DIRECT: LDL DIRECT: 139 mg/dL

## 2017-10-05 LAB — HEPATIC FUNCTION PANEL
ALBUMIN: 4.4 g/dL (ref 3.5–5.2)
ALT: 11 U/L (ref 0–35)
AST: 14 U/L (ref 0–37)
Alkaline Phosphatase: 83 U/L (ref 39–117)
Bilirubin, Direct: 0.2 mg/dL (ref 0.0–0.3)
TOTAL PROTEIN: 7.1 g/dL (ref 6.0–8.3)
Total Bilirubin: 0.5 mg/dL (ref 0.2–1.2)

## 2017-10-05 LAB — LIPID PANEL
CHOL/HDL RATIO: 4
CHOLESTEROL: 220 mg/dL — AB (ref 0–200)
HDL: 53.3 mg/dL (ref >39.00)
NonHDL: 166.6
TRIGLYCERIDES: 223 mg/dL — AB (ref 0.0–149.0)
VLDL: 44.6 mg/dL — AB (ref 0.0–40.0)

## 2017-10-05 LAB — POTASSIUM: Potassium: 4.4 mEq/L (ref 3.5–5.1)

## 2017-10-05 LAB — HEMOGLOBIN A1C: Hgb A1c MFr Bld: 5.4 % (ref 4.6–6.5)

## 2017-10-09 ENCOUNTER — Ambulatory Visit (INDEPENDENT_AMBULATORY_CARE_PROVIDER_SITE_OTHER): Payer: No Typology Code available for payment source | Admitting: Internal Medicine

## 2017-10-09 ENCOUNTER — Encounter: Payer: Self-pay | Admitting: Internal Medicine

## 2017-10-09 DIAGNOSIS — Z6834 Body mass index (BMI) 34.0-34.9, adult: Secondary | ICD-10-CM | POA: Diagnosis not present

## 2017-10-09 DIAGNOSIS — E7800 Pure hypercholesterolemia, unspecified: Secondary | ICD-10-CM

## 2017-10-09 DIAGNOSIS — I10 Essential (primary) hypertension: Secondary | ICD-10-CM | POA: Diagnosis not present

## 2017-10-09 DIAGNOSIS — Z86010 Personal history of colonic polyps: Secondary | ICD-10-CM

## 2017-10-09 DIAGNOSIS — R739 Hyperglycemia, unspecified: Secondary | ICD-10-CM | POA: Diagnosis not present

## 2017-10-09 DIAGNOSIS — F439 Reaction to severe stress, unspecified: Secondary | ICD-10-CM

## 2017-10-09 NOTE — Assessment & Plan Note (Addendum)
States had colonoscopy last year.  Need results.  Colonoscopy report received after pt left.  Colonoscopy 05/26/17 - hyperplastic polyp.  Recommended f/u colonoscopy in 5 years.  (Dr Alice Reichert).

## 2017-10-09 NOTE — Assessment & Plan Note (Signed)
Doing well. Continue zoloft.  Follow.  

## 2017-10-09 NOTE — Progress Notes (Signed)
Patient ID: BERLYN SAYLOR, female   DOB: [birth year], 59 y.o.   MRN: [id number]   Subjective:    Patient ID: Floydene Flock, female    DOB: [birth year], 57 y.o.   MRN: [id number]  HPI  Patient here for a scheduled follow up.  She reports she is doing well.  Some increased stress with her sick cat, but overall she feels she is doing relatively well.  Feels zoloft is working well.  Discussed her recent labs.  Discussed diet and exercise.  Discussed calculated cholesterol risk.  Not recommended to start statin medication.  No chest pain.  No sob.  No acid reflux.  No abdominal pain.  Bowels moving.     Past Medical History:  Diagnosis Date  . Abnormal mammogram 10/15/2012  . Adult BMI 35.0-35.9 kg/sq m 01/31/2017  . Arthritis   . Asthma   . Chest pain 03/13/2016  . Health care maintenance 11/30/2014   Mammogram 12/22/15 - Birads 0.  Recommended f/u right breat mammogram.  Follow up right breast mammogram 01/11/16 - Birads I.  Recommended f/u mammogram in one year.     . Hematuria   . Hemorrhoid   . History of colonic polyps 02/27/2015  . Hypercholesterolemia 07/16/2012  . Hypertension   . Menopausal syndrome 07/22/2012  . Plantar fasciitis of left foot 07/15/2013  . Stress 05/17/2014   Past Surgical History:  Procedure Laterality Date  . COLONOSCOPY  2013  . EYELID LACERATION REPAIR  2006  . TUBAL LIGATION  1995   Family History  Problem Relation Age of Onset  . Hyperlipidemia Mother   . Diabetes Mother   . Hypertension Mother   . Hyperlipidemia Father   . Diabetes Father   . Hypertension Father   . Mental illness Maternal Grandmother   . Mental illness Maternal Grandfather   . Mental illness Paternal Grandmother   . Mental illness Paternal Grandfather   . Breast cancer Neg Hx    Social History   Socioeconomic History  . Marital status: Married    Spouse name: None  . Number of children: None  . Years of education: None  . Highest education level: None  Social Needs  .  Financial resource strain: None  . Food insecurity - worry: None  . Food insecurity - inability: None  . Transportation needs - medical: None  . Transportation needs - non-medical: None  Occupational History  . None  Tobacco Use  . Smoking status: Never Smoker  . Smokeless tobacco: Never Used  Substance and Sexual Activity  . Alcohol use: Yes    Alcohol/week: 1.2 oz    Types: 2 Standard drinks or equivalent per week    Comment: ocassionally  . Drug use: No  . Sexual activity: None  Other Topics Concern  . None  Social History Narrative  . None    Outpatient Encounter Medications as of 10/09/2017  Medication Sig  . acetaminophen (TYLENOL) 325 MG tablet Take 650 mg by mouth every 6 (six) hours as needed for pain.  Marland Kitchen aspirin 81 MG tablet Take 81 mg by mouth daily.  . fluticasone (FLONASE) 50 MCG/ACT nasal spray Place 2 sprays into both nostrils daily. Hold until pt request  . loratadine (CLARITIN) 10 MG tablet Take 10 mg by mouth daily.  . sertraline (ZOLOFT) 50 MG tablet Take 1 tablet (50 mg total) daily by mouth.   No facility-administered encounter medications on file as of 10/09/2017.     Review of Systems  Constitutional:  Negative for appetite change and unexpected weight change.  HENT: Negative for congestion and sinus pressure.   Respiratory: Negative for cough, chest tightness and shortness of breath.   Cardiovascular: Negative for chest pain, palpitations and leg swelling.  Gastrointestinal: Negative for abdominal pain, diarrhea, nausea and vomiting.  Genitourinary: Negative for difficulty urinating and dysuria.  Musculoskeletal: Negative for joint swelling and myalgias.  Skin: Negative for color change and rash.  Neurological: Negative for dizziness, light-headedness and headaches.  Psychiatric/Behavioral: Negative for agitation and dysphoric mood.       Objective:     Blood pressure rechecked by me:  130/82  Physical Exam  Constitutional: She appears  well-developed and well-nourished. No distress.  HENT:  Nose: Nose normal.  Mouth/Throat: Oropharynx is clear and moist.  Neck: Neck supple. No thyromegaly present.  Cardiovascular: Normal rate and regular rhythm.  Pulmonary/Chest: Breath sounds normal. No respiratory distress. She has no wheezes.  Abdominal: Soft. Bowel sounds are normal. There is no tenderness.  Musculoskeletal: She exhibits no edema or tenderness.  Lymphadenopathy:    She has no cervical adenopathy.  Skin: No rash noted. No erythema.  Psychiatric: She has a normal mood and affect. Her behavior is normal.    BP 122/78 (BP Location: Left Arm, Patient Position: Sitting, Cuff Size: Large)   Pulse 80   Temp 97.7 F (36.5 C) (Oral)   Resp 18   Wt 191 lb 3.2 oz (86.7 kg)   LMP 07/16/2009   SpO2 99%   BMI 34.97 kg/m  Wt Readings from Last 3 Encounters:  10/09/17 191 lb 3.2 oz (86.7 kg)  06/05/17 194 lb 12.8 oz (88.4 kg)  02/09/17 192 lb (87.1 kg)     Lab Results  Component Value Date   WBC 5.2 06/01/2017   HGB 13.0 06/01/2017   HCT 39.8 06/01/2017   PLT 337.0 06/01/2017   GLUCOSE 105 (H) 10/05/2017   CHOL 220 (H) 10/05/2017   TRIG 223.0 (H) 10/05/2017   HDL 53.30 10/05/2017   LDLDIRECT 139.0 10/05/2017   LDLCALC 137 (H) 01/27/2017   ALT 11 10/05/2017   AST 14 10/05/2017   NA 137 10/05/2017   K 4.4 10/05/2017   K 4.4 10/05/2017   CL 101 10/05/2017   CREATININE 0.59 10/05/2017   BUN 11 10/05/2017   CO2 30 10/05/2017   TSH 3.81 06/01/2017   HGBA1C 5.4 10/05/2017    Mm Screening Breast Tomo Bilateral  Result Date: 02/10/2017 CLINICAL DATA:  Screening. EXAM: 2D DIGITAL SCREENING BILATERAL MAMMOGRAM WITH CAD AND ADJUNCT TOMO COMPARISON:  Previous exam(s). ACR Breast Density Category b: There are scattered areas of fibroglandular density. FINDINGS: There are no findings suspicious for malignancy. Images were processed with CAD. IMPRESSION: No mammographic evidence of malignancy. A result letter of this  screening mammogram will be mailed directly to the patient. RECOMMENDATION: Screening mammogram in one year. (Code:SM-B-01Y) BI-RADS CATEGORY  1: Negative. Electronically Signed   By: Curlene Dolphin M.D.   On: 02/10/2017 14:31       Assessment & Plan:   Problem List Items Addressed This Visit    BMI 34.0-34.9,adult    Discussed diet and exercise.  Follow.        History of colonic polyps    States had colonoscopy last year.  Need results.  Colonoscopy report received after pt left.  Colonoscopy 05/26/17 - hyperplastic polyp.  Recommended f/u colonoscopy in 5 years.  (Dr Alice Reichert).        Hypercholesterolemia    Discussed  diet and exercise.  Follow lipid panel.        Relevant Orders   Hepatic function panel   Lipid panel   Hyperglycemia    Low carb diet and exercise.  Follow met b and a1c.   Lab Results  Component Value Date   HGBA1C 5.4 10/05/2017        Relevant Orders   Hemoglobin A1c   Hypertension    Blood pressure under good control.  Continue same medication regimen.  Follow pressures.  Follow metabolic panel.        Relevant Orders   Basic metabolic panel   Stress    Doing well.  Continue zoloft.  Follow.           Einar Pheasant, MD

## 2017-10-09 NOTE — Assessment & Plan Note (Signed)
Discussed diet and exercise.  Follow.  

## 2017-10-09 NOTE — Assessment & Plan Note (Signed)
Discussed diet and exercise.  Follow lipid panel.  

## 2017-10-09 NOTE — Assessment & Plan Note (Signed)
Low carb diet and exercise.  Follow met b and a1c.   Lab Results  Component Value Date   HGBA1C 5.4 10/05/2017

## 2017-10-09 NOTE — Assessment & Plan Note (Signed)
Blood pressure under good control.  Continue same medication regimen.  Follow pressures.  Follow metabolic panel.   

## 2017-12-19 ENCOUNTER — Telehealth: Payer: No Typology Code available for payment source | Admitting: Family

## 2017-12-19 DIAGNOSIS — J302 Other seasonal allergic rhinitis: Secondary | ICD-10-CM

## 2017-12-19 MED ORDER — PREDNISONE 10 MG (21) PO TBPK: ORAL_TABLET | ORAL | 0 refills | Status: DC

## 2017-12-19 NOTE — Progress Notes (Signed)
We are sorry that you are not feeling well.  Here is how we plan to help!  Based on your presentation I believe you most likely have A cough due to allergies.  I recommend that you start the an over-the counter-allergy medication such as Claritin 10 mg or Zyrtec 10 mg daily.     In addition you may use A non-prescription cough medication called Robitussin DAC. Take 2 teaspoons every 8 hours or Delsym: take 2 teaspoons every 12 hours.  Prednisone 10 mg daily for 6 days (see taper instructions below)  Directions for 6 day taper: Day 1: 2 tablets before breakfast, 1 after both lunch & dinner and 2 at bedtime Day 2: 1 tab before breakfast, 1 after both lunch & dinner and 2 at bedtime Day 3: 1 tab at each meal & 1 at bedtime Day 4: 1 tab at breakfast, 1 at lunch, 1 at bedtime Day 5: 1 tab at breakfast & 1 tab at bedtime Day 6: 1 tab at breakfast   From your responses in the eVisit questionnaire you describe inflammation in the upper respiratory tract which is causing a significant cough.  This is commonly called Bronchitis and has four common causes:    Allergies  Viral Infections  Acid Reflux  Bacterial Infection Allergies, viruses and acid reflux are treated by controlling symptoms or eliminating the cause. An example might be a cough caused by taking certain blood pressure medications. You stop the cough by changing the medication. Another example might be a cough caused by acid reflux. Controlling the reflux helps control the cough.  USE OF BRONCHODILATOR ("RESCUE") INHALERS: There is a risk from using your bronchodilator too frequently.  The risk is that over-reliance on a medication which only relaxes the muscles surrounding the breathing tubes can reduce the effectiveness of medications prescribed to reduce swelling and congestion of the tubes themselves.  Although you feel brief relief from the bronchodilator inhaler, your asthma may actually be worsening with the tubes becoming more  swollen and filled with mucus.  This can delay other crucial treatments, such as oral steroid medications. If you need to use a bronchodilator inhaler daily, several times per day, you should discuss this with your provider.  There are probably better treatments that could be used to keep your asthma under control.     HOME CARE . Only take medications as instructed by your medical team. . Complete the entire course of an antibiotic. . Drink plenty of fluids and get plenty of rest. . Avoid close contacts especially the very young and the elderly . Cover your mouth if you cough or cough into your sleeve. . Always remember to wash your hands . A steam or ultrasonic humidifier can help congestion.   GET HELP RIGHT AWAY IF: . You develop worsening fever. . You become short of breath . You cough up blood. . Your symptoms persist after you have completed your treatment plan MAKE SURE YOU   Understand these instructions.  Will watch your condition.  Will get help right away if you are not doing well or get worse.  Your e-visit answers were reviewed by a board certified advanced clinical practitioner to complete your personal care plan.  Depending on the condition, your plan could have included both over the counter or prescription medications. If there is a problem please reply  once you have received a response from your provider. Your safety is important to [place].  If you have drug allergies check your  prescription carefully.    You can use MyChart to ask questions about today's visit, request a non-urgent call back, or ask for a work or school excuse for 24 hours related to this e-Visit. If it has been greater than 24 hours you will need to follow up with your provider, or enter a new e-Visit to address those concerns. You will get an e-mail in the next two days asking about your experience.  I hope that your e-visit has been valuable and will speed your recovery. Thank you for using  e-visits.

## 2017-12-21 ENCOUNTER — Telehealth: Payer: No Typology Code available for payment source | Admitting: Family

## 2017-12-21 DIAGNOSIS — B9689 Other specified bacterial agents as the cause of diseases classified elsewhere: Secondary | ICD-10-CM | POA: Diagnosis not present

## 2017-12-21 DIAGNOSIS — J028 Acute pharyngitis due to other specified organisms: Secondary | ICD-10-CM | POA: Diagnosis not present

## 2017-12-21 MED ORDER — AZITHROMYCIN 250 MG PO TABS: ORAL_TABLET | ORAL | 0 refills | Status: DC

## 2017-12-21 MED ORDER — BENZONATATE 100 MG PO CAPS: 100.0000 mg | ORAL_CAPSULE | Freq: Three times a day (TID) | ORAL | 0 refills | Status: DC | PRN

## 2017-12-21 NOTE — Progress Notes (Signed)
Thank you for the details you included in the comment boxes. Those details are very helpful in determining the best course of treatment for you and help [place] to provide the best care. I see where you were treated for symptoms that were worsening 4 days ago and you continue to worsen. I agree that this is likely an infection.  We are sorry that you are not feeling well.  Here is how we plan to help!  Based on your presentation I believe you most likely have A cough due to bacteria.  When patients have a fever and a productive cough with a change in color or increased sputum production, we are concerned about bacterial bronchitis.  If left untreated it can progress to pneumonia.  If your symptoms do not improve with your treatment plan it is important that you contact your provider.   I have prescribed Azithromyin 250 mg: two tablets now and then one tablet daily for 4 additonal days    In addition you may use A non-prescription cough medication called Mucinex DM: take 2 tablets every 12 hours. and A prescription cough medication called Tessalon Perles 100mg . You may take 1-2 capsules every 8 hours as needed for your cough.    From your responses in the eVisit questionnaire you describe inflammation in the upper respiratory tract which is causing a significant cough.  This is commonly called Bronchitis and has four common causes:    Allergies  Viral Infections  Acid Reflux  Bacterial Infection Allergies, viruses and acid reflux are treated by controlling symptoms or eliminating the cause. An example might be a cough caused by taking certain blood pressure medications. You stop the cough by changing the medication. Another example might be a cough caused by acid reflux. Controlling the reflux helps control the cough.  USE OF BRONCHODILATOR ("RESCUE") INHALERS: There is a risk from using your bronchodilator too frequently.  The risk is that over-reliance on a medication which only relaxes the muscles  surrounding the breathing tubes can reduce the effectiveness of medications prescribed to reduce swelling and congestion of the tubes themselves.  Although you feel brief relief from the bronchodilator inhaler, your asthma may actually be worsening with the tubes becoming more swollen and filled with mucus.  This can delay other crucial treatments, such as oral steroid medications. If you need to use a bronchodilator inhaler daily, several times per day, you should discuss this with your provider.  There are probably better treatments that could be used to keep your asthma under control.     HOME CARE . Only take medications as instructed by your medical team. . Complete the entire course of an antibiotic. . Drink plenty of fluids and get plenty of rest. . Avoid close contacts especially the very young and the elderly . Cover your mouth if you cough or cough into your sleeve. . Always remember to wash your hands . A steam or ultrasonic humidifier can help congestion.   GET HELP RIGHT AWAY IF: . You develop worsening fever. . You become short of breath . You cough up blood. . Your symptoms persist after you have completed your treatment plan MAKE SURE YOU   Understand these instructions.  Will watch your condition.  Will get help right away if you are not doing well or get worse.  Your e-visit answers were reviewed by a board certified advanced clinical practitioner to complete your personal care plan.  Depending on the condition, your plan could have included both  over the counter or prescription medications. If there is a problem please reply  once you have received a response from your provider. Your safety is important to [place].  If you have drug allergies check your prescription carefully.    You can use MyChart to ask questions about today's visit, request a non-urgent call back, or ask for a work or school excuse for 24 hours related to this e-Visit. If it has been greater than 24  hours you will need to follow up with your provider, or enter a new e-Visit to address those concerns. You will get an e-mail in the next two days asking about your experience.  I hope that your e-visit has been valuable and will speed your recovery. Thank you for using e-visits.

## 2017-12-25 ENCOUNTER — Encounter: Payer: Self-pay | Admitting: Internal Medicine

## 2017-12-26 NOTE — Telephone Encounter (Signed)
Please call and confirm doing ok.  Let her know that I would like to see her in the office.  May want to try a different medication (other than hctz).  If agreeable, I would like to see her 01/01/18 at 8:00.  Thanks    Dr Nicki Reaper

## 2017-12-27 NOTE — Telephone Encounter (Signed)
LMTCB

## 2018-01-01 ENCOUNTER — Ambulatory Visit (INDEPENDENT_AMBULATORY_CARE_PROVIDER_SITE_OTHER): Payer: No Typology Code available for payment source | Admitting: Internal Medicine

## 2018-01-01 ENCOUNTER — Encounter: Payer: Self-pay | Admitting: Internal Medicine

## 2018-01-01 DIAGNOSIS — F439 Reaction to severe stress, unspecified: Secondary | ICD-10-CM | POA: Diagnosis not present

## 2018-01-01 DIAGNOSIS — I10 Essential (primary) hypertension: Secondary | ICD-10-CM

## 2018-01-01 MED ORDER — ALBUTEROL SULFATE HFA 108 (90 BASE) MCG/ACT IN AERS: 2.0000 | INHALATION_SPRAY | Freq: Four times a day (QID) | RESPIRATORY_TRACT | 0 refills | Status: DC | PRN

## 2018-01-01 NOTE — Progress Notes (Signed)
Patient ID: Ashley Waller, female   DOB: 06-04-1961, 57 y.o.   MRN: [id number]   Subjective:    Patient ID: Ashley Waller, female    DOB: 29-May-1961, 57 y.o.   MRN: [id number]  HPI  Patient here as a work in to discuss her elevated blood pressure - noticed recently.  She has been on abx and prednisone recently.  Off now.  Blood pressure appears to be doing better now.  No headache.  No dizziness.  No chest pain or sob.  Overall she feels good.  Handling stress.  No increased cough or congestion currently.  Bowels stable.     Past Medical History:  Diagnosis Date  . Abnormal mammogram 10/15/2012  . Adult BMI 35.0-35.9 kg/sq m 01/31/2017  . Arthritis   . Asthma   . Chest pain 03/13/2016  . Health care maintenance 11/30/2014   Mammogram 12/22/15 - Birads 0.  Recommended f/u right breat mammogram.  Follow up right breast mammogram 01/11/16 - Birads I.  Recommended f/u mammogram in one year.     . Hematuria   . Hemorrhoid   . History of colonic polyps 02/27/2015  . Hypercholesterolemia 07/16/2012  . Hypertension   . Menopausal syndrome 07/22/2012  . Plantar fasciitis of left foot 07/15/2013  . Stress 05/17/2014   Past Surgical History:  Procedure Laterality Date  . COLONOSCOPY  2013  . EYELID LACERATION REPAIR  2006  . TUBAL LIGATION  1995   Family History  Problem Relation Age of Onset  . Hyperlipidemia Mother   . Diabetes Mother   . Hypertension Mother   . Hyperlipidemia Father   . Diabetes Father   . Hypertension Father   . Mental illness Maternal Grandmother   . Mental illness Maternal Grandfather   . Mental illness Paternal Grandmother   . Mental illness Paternal Grandfather   . Breast cancer Neg Hx    Social History   Socioeconomic History  . Marital status: Married    Spouse name: Not on file  . Number of children: Not on file  . Years of education: Not on file  . Highest education level: Not on file  Occupational History  . Not on file  Social Needs  . Financial  resource strain: Not on file  . Food insecurity:    Worry: Not on file    Inability: Not on file  . Transportation needs:    Medical: Not on file    Non-medical: Not on file  Tobacco Use  . Smoking status: Never Smoker  . Smokeless tobacco: Never Used  Substance and Sexual Activity  . Alcohol use: Yes    Alcohol/week: 1.2 oz    Types: 2 Standard drinks or equivalent per week    Comment: ocassionally  . Drug use: No  . Sexual activity: Not on file  Lifestyle  . Physical activity:    Days per week: Not on file    Minutes per session: Not on file  . Stress: Not on file  Relationships  . Social connections:    Talks on phone: Not on file    Gets together: Not on file    Attends religious service: Not on file    Active member of club or organization: Not on file    Attends meetings of clubs or organizations: Not on file    Relationship status: Not on file  Other Topics Concern  . Not on file  Social History Narrative  . Not on file  Outpatient Encounter Medications as of 01/01/2018  Medication Sig  . acetaminophen (TYLENOL) 325 MG tablet Take 650 mg by mouth every 6 (six) hours as needed for pain.  Marland Kitchen aspirin 81 MG tablet Take 81 mg by mouth daily.  . benzonatate (TESSALON PERLES) 100 MG capsule Take 1-2 capsules (100-200 mg total) by mouth every 8 (eight) hours as needed for cough.  . fluticasone (FLONASE) 50 MCG/ACT nasal spray Place 2 sprays into both nostrils daily. Hold until pt request  . loratadine (CLARITIN) 10 MG tablet Take 10 mg by mouth daily.  . sertraline (ZOLOFT) 50 MG tablet Take 1 tablet (50 mg total) daily by mouth.  Marland Kitchen albuterol (PROVENTIL HFA;VENTOLIN HFA) 108 (90 Base) MCG/ACT inhaler Inhale 2 puffs into the lungs every 6 (six) hours as needed for wheezing or shortness of breath.  . [DISCONTINUED] azithromycin (ZITHROMAX) 250 MG tablet Take 2 tabs now then 1 daily times 4 days  . [DISCONTINUED] predniSONE (STERAPRED UNI-PAK 21 TAB) 10 MG (21) TBPK tablet As  directed   No facility-administered encounter medications on file as of 01/01/2018.     Review of Systems  Constitutional: Negative for appetite change and unexpected weight change.  HENT: Negative for congestion and sinus pressure.   Respiratory: Negative for cough, chest tightness and shortness of breath.   Cardiovascular: Negative for chest pain, palpitations and leg swelling.  Gastrointestinal: Negative for abdominal pain, diarrhea, nausea and vomiting.  Musculoskeletal: Negative for joint swelling and myalgias.  Skin: Negative for color change and rash.  Neurological: Negative for dizziness, light-headedness and headaches.  Psychiatric/Behavioral: Negative for agitation and dysphoric mood.       Objective:    Physical Exam  Constitutional: She appears well-developed and well-nourished. No distress.  HENT:  Nose: Nose normal.  Mouth/Throat: Oropharynx is clear and moist.  Neck: Neck supple. No thyromegaly present.  Cardiovascular: Normal rate and regular rhythm.  Pulmonary/Chest: Breath sounds normal. No respiratory distress. She has no wheezes.  Abdominal: Soft. Bowel sounds are normal. There is no tenderness.  Musculoskeletal: She exhibits no edema or tenderness.  Lymphadenopathy:    She has no cervical adenopathy.  Skin: No rash noted. No erythema.  Psychiatric: She has a normal mood and affect. Her behavior is normal.    BP 122/78 (BP Location: Left Arm, Patient Position: Sitting, Cuff Size: Large)   Pulse 73   Temp 98 F (36.7 C) (Oral)   Resp 16   Wt 192 lb (87.1 kg)   LMP 07/16/2009   SpO2 98%   BMI 35.12 kg/m  Wt Readings from Last 3 Encounters:  01/01/18 192 lb (87.1 kg)  10/09/17 191 lb 3.2 oz (86.7 kg)  06/05/17 194 lb 12.8 oz (88.4 kg)     Lab Results  Component Value Date   WBC 5.2 06/01/2017   HGB 13.0 06/01/2017   HCT 39.8 06/01/2017   PLT 337.0 06/01/2017   GLUCOSE 105 (H) 10/05/2017   CHOL 220 (H) 10/05/2017   TRIG 223.0 (H) 10/05/2017     HDL 53.30 10/05/2017   LDLDIRECT 139.0 10/05/2017   LDLCALC 137 (H) 01/27/2017   ALT 11 10/05/2017   AST 14 10/05/2017   NA 137 10/05/2017   K 4.4 10/05/2017   K 4.4 10/05/2017   CL 101 10/05/2017   CREATININE 0.59 10/05/2017   BUN 11 10/05/2017   CO2 30 10/05/2017   TSH 3.81 06/01/2017   HGBA1C 5.4 10/05/2017    Mm Screening Breast Tomo Bilateral  Result Date: 02/10/2017 CLINICAL  DATA:  Screening. EXAM: 2D DIGITAL SCREENING BILATERAL MAMMOGRAM WITH CAD AND ADJUNCT TOMO COMPARISON:  Previous exam(s). ACR Breast Density Category b: There are scattered areas of fibroglandular density. FINDINGS: There are no findings suspicious for malignancy. Images were processed with CAD. IMPRESSION: No mammographic evidence of malignancy. A result letter of this screening mammogram will be mailed directly to the patient. RECOMMENDATION: Screening mammogram in one year. (Code:SM-B-01Y) BI-RADS CATEGORY  1: Negative. Electronically Signed   By: Curlene Dolphin M.D.   On: 02/10/2017 14:31       Assessment & Plan:   Problem List Items Addressed This Visit    Hypertension    On no medication now.  Had noticed some elevation recently.  Was on prednisone and abx.  Off now.  Blood pressure better.  Will hold on adding medication.  She will spot check her pressure.  Notify me of results.  Avoid increased salt intake.  Discussed diet and exercise.  Follow.       Stress    Overall doing well on current regimen.  Follow.           Einar Pheasant, MD

## 2018-01-02 ENCOUNTER — Encounter: Payer: Self-pay | Admitting: Internal Medicine

## 2018-01-02 NOTE — Assessment & Plan Note (Signed)
On no medication now.  Had noticed some elevation recently.  Was on prednisone and abx.  Off now.  Blood pressure better.  Will hold on adding medication.  She will spot check her pressure.  Notify me of results.  Avoid increased salt intake.  Discussed diet and exercise.  Follow.

## 2018-01-02 NOTE — Assessment & Plan Note (Signed)
Overall doing well on current regimen.  Follow.

## 2018-02-07 ENCOUNTER — Other Ambulatory Visit: Payer: No Typology Code available for payment source

## 2018-02-09 ENCOUNTER — Encounter: Payer: No Typology Code available for payment source | Admitting: Internal Medicine

## 2018-03-19 ENCOUNTER — Other Ambulatory Visit: Payer: Self-pay | Admitting: Internal Medicine

## 2018-03-19 DIAGNOSIS — Z1231 Encounter for screening mammogram for malignant neoplasm of breast: Secondary | ICD-10-CM

## 2018-04-26 ENCOUNTER — Ambulatory Visit
Admission: RE | Admit: 2018-04-26 | Discharge: 2018-04-26 | Disposition: A | Discharge status: Home / Self Care | Payer: No Typology Code available for payment source | Source: Ambulatory Visit | Attending: Internal Medicine | Admitting: Internal Medicine

## 2018-04-26 DIAGNOSIS — Z1231 Encounter for screening mammogram for malignant neoplasm of breast: Secondary | ICD-10-CM | POA: Insufficient documentation

## 2018-05-21 ENCOUNTER — Other Ambulatory Visit (INDEPENDENT_AMBULATORY_CARE_PROVIDER_SITE_OTHER): Payer: No Typology Code available for payment source

## 2018-05-21 DIAGNOSIS — R739 Hyperglycemia, unspecified: Secondary | ICD-10-CM | POA: Diagnosis not present

## 2018-05-21 DIAGNOSIS — E7800 Pure hypercholesterolemia, unspecified: Secondary | ICD-10-CM

## 2018-05-21 DIAGNOSIS — I10 Essential (primary) hypertension: Secondary | ICD-10-CM | POA: Diagnosis not present

## 2018-05-21 LAB — HEPATIC FUNCTION PANEL
ALBUMIN: 4 g/dL (ref 3.5–5.2)
ALT: 11 U/L (ref 0–35)
AST: 14 U/L (ref 0–37)
Alkaline Phosphatase: 76 U/L (ref 39–117)
Bilirubin, Direct: 0.1 mg/dL (ref 0.0–0.3)
Total Bilirubin: 0.4 mg/dL (ref 0.2–1.2)
Total Protein: 6.7 g/dL (ref 6.0–8.3)

## 2018-05-21 LAB — LIPID PANEL
CHOLESTEROL: 190 mg/dL (ref 0–200)
HDL: 48.2 mg/dL (ref >39.00)
LDL Cholesterol: 111 mg/dL — ABNORMAL HIGH (ref 0–99)
NonHDL: 142.18
TRIGLYCERIDES: 154 mg/dL — AB (ref 0.0–149.0)
Total CHOL/HDL Ratio: 4
VLDL: 30.8 mg/dL (ref 0.0–40.0)

## 2018-05-21 LAB — BASIC METABOLIC PANEL
BUN: 13 mg/dL (ref 6–23)
CO2: 29 mEq/L (ref 19–32)
Calcium: 9 mg/dL (ref 8.4–10.5)
Chloride: 103 mEq/L (ref 96–112)
Creatinine, Ser: 0.68 mg/dL (ref 0.40–1.20)
GFR: 93.89 mL/min (ref >60.00)
Glucose, Bld: 104 mg/dL — ABNORMAL HIGH (ref 70–99)
POTASSIUM: 4.2 meq/L (ref 3.5–5.1)
Sodium: 139 mEq/L (ref 135–145)

## 2018-05-21 LAB — HEMOGLOBIN A1C: HEMOGLOBIN A1C: 5.6 % (ref 4.6–6.5)

## 2018-05-22 ENCOUNTER — Encounter: Payer: Self-pay | Admitting: Internal Medicine

## 2018-05-24 ENCOUNTER — Encounter

## 2018-05-24 ENCOUNTER — Ambulatory Visit (INDEPENDENT_AMBULATORY_CARE_PROVIDER_SITE_OTHER): Payer: No Typology Code available for payment source | Admitting: Internal Medicine

## 2018-05-24 ENCOUNTER — Other Ambulatory Visit (HOSPITAL_COMMUNITY)
Admission: RE | Admit: 2018-05-24 | Discharge: 2018-05-24 | Disposition: A | Discharge status: Home / Self Care | Payer: No Typology Code available for payment source | Source: Ambulatory Visit | Attending: Internal Medicine | Admitting: Internal Medicine

## 2018-05-24 VITALS — BP 130/78 | HR 87 | Temp 97.6°F | Resp 18 | Ht 62.0 in | Wt 194.0 lb

## 2018-05-24 DIAGNOSIS — Z0000 Encounter for general adult medical examination without abnormal findings: Secondary | ICD-10-CM

## 2018-05-24 DIAGNOSIS — I10 Essential (primary) hypertension: Secondary | ICD-10-CM

## 2018-05-24 DIAGNOSIS — Z124 Encounter for screening for malignant neoplasm of cervix: Secondary | ICD-10-CM | POA: Insufficient documentation

## 2018-05-24 DIAGNOSIS — E7800 Pure hypercholesterolemia, unspecified: Secondary | ICD-10-CM

## 2018-05-24 DIAGNOSIS — F439 Reaction to severe stress, unspecified: Secondary | ICD-10-CM

## 2018-05-24 DIAGNOSIS — R739 Hyperglycemia, unspecified: Secondary | ICD-10-CM | POA: Diagnosis not present

## 2018-05-24 NOTE — Progress Notes (Signed)
Patient ID: Ashley Waller, female   DOB: 1961/04/22, 57 y.o.   MRN: [id number]   Subjective:    Patient ID: Ashley Waller, female    DOB: 1961/06/04, 57 y.o.   MRN: [id number]  HPI  Patient here for her physical exam.  She reports she is doing relatively well.   Some increased stress with her work.  Overall she feels she is handling things relatively well.  Discussed diet and exercise.  She is not watching her diet and not exercising.  No chest pain.  No sob.  No acid reflux.  No abdominal pain.  Bowels moving.  Discussed labs.  Discussed calculated cholesterol risk.  She declines cholesterol medication.     Past Medical History:  Diagnosis Date  . Abnormal mammogram 10/15/2012  . Adult BMI 35.0-35.9 kg/sq m 01/31/2017  . Arthritis   . Asthma   . Chest pain 03/13/2016  . Health care maintenance 11/30/2014   Mammogram 12/22/15 - Birads 0.  Recommended f/u right breat mammogram.  Follow up right breast mammogram 01/11/16 - Birads I.  Recommended f/u mammogram in one year.     . Hematuria   . Hemorrhoid   . History of colonic polyps 02/27/2015  . Hypercholesterolemia 07/16/2012  . Hypertension   . Menopausal syndrome 07/22/2012  . Plantar fasciitis of left foot 07/15/2013  . Stress 05/17/2014   Past Surgical History:  Procedure Laterality Date  . COLONOSCOPY  2013  . EYELID LACERATION REPAIR  2006  . TUBAL LIGATION  1995   Family History  Problem Relation Age of Onset  . Hyperlipidemia Mother   . Diabetes Mother   . Hypertension Mother   . Hyperlipidemia Father   . Diabetes Father   . Hypertension Father   . Mental illness Maternal Grandmother   . Mental illness Maternal Grandfather   . Mental illness Paternal Grandmother   . Mental illness Paternal Grandfather   . Breast cancer Neg Hx    Social History   Socioeconomic History  . Marital status: Married    Spouse name: Not on file  . Number of children: Not on file  . Years of education: Not on file  . Highest education  level: Not on file  Occupational History  . Not on file  Social Needs  . Financial resource strain: Not on file  . Food insecurity:    Worry: Not on file    Inability: Not on file  . Transportation needs:    Medical: Not on file    Non-medical: Not on file  Tobacco Use  . Smoking status: Never Smoker  . Smokeless tobacco: Never Used  Substance and Sexual Activity  . Alcohol use: Yes    Alcohol/week: 2.0 standard drinks    Types: 2 Standard drinks or equivalent per week    Comment: ocassionally  . Drug use: No  . Sexual activity: Not on file  Lifestyle  . Physical activity:    Days per week: Not on file    Minutes per session: Not on file  . Stress: Not on file  Relationships  . Social connections:    Talks on phone: Not on file    Gets together: Not on file    Attends religious service: Not on file    Active member of club or organization: Not on file    Attends meetings of clubs or organizations: Not on file    Relationship status: Not on file  Other Topics Concern  . Not on  file  Social History Narrative  . Not on file    Outpatient Encounter Medications as of 05/24/2018  Medication Sig  . acetaminophen (TYLENOL) 325 MG tablet Take 650 mg by mouth every 6 (six) hours as needed for pain.  Marland Kitchen albuterol (PROVENTIL HFA;VENTOLIN HFA) 108 (90 Base) MCG/ACT inhaler Inhale 2 puffs into the lungs every 6 (six) hours as needed for wheezing or shortness of breath.  Marland Kitchen aspirin 81 MG tablet Take 81 mg by mouth daily.  . benzonatate (TESSALON PERLES) 100 MG capsule Take 1-2 capsules (100-200 mg total) by mouth every 8 (eight) hours as needed for cough.  . fluticasone (FLONASE) 50 MCG/ACT nasal spray Place 2 sprays into both nostrils daily. Hold until pt request  . loratadine (CLARITIN) 10 MG tablet Take 10 mg by mouth daily.  . sertraline (ZOLOFT) 50 MG tablet Take 1 tablet (50 mg total) daily by mouth.   No facility-administered encounter medications on file as of 05/24/2018.      Review of Systems  Constitutional: Negative for appetite change and unexpected weight change.  HENT: Negative for congestion and sinus pressure.   Eyes: Negative for pain and visual disturbance.  Respiratory: Negative for cough, chest tightness and shortness of breath.   Cardiovascular: Negative for chest pain, palpitations and leg swelling.  Gastrointestinal: Negative for abdominal pain, diarrhea, nausea and vomiting.  Genitourinary: Negative for difficulty urinating and dysuria.  Musculoskeletal: Negative for joint swelling and myalgias.  Skin: Negative for color change and rash.  Neurological: Negative for dizziness, light-headedness and headaches.  Hematological: Negative for adenopathy. Does not bruise/bleed easily.  Psychiatric/Behavioral: Negative for agitation and dysphoric mood.       Objective:    Physical Exam  Constitutional: She is oriented to person, place, and time. She appears well-developed and well-nourished. No distress.  HENT:  Nose: Nose normal.  Mouth/Throat: Oropharynx is clear and moist.  Eyes: Right eye exhibits no discharge. Left eye exhibits no discharge. No scleral icterus.  Neck: Neck supple. No thyromegaly present.  Cardiovascular: Normal rate and regular rhythm.  Pulmonary/Chest: Breath sounds normal. No accessory muscle usage. No tachypnea. No respiratory distress. She has no decreased breath sounds. She has no wheezes. She has no rhonchi. Right breast exhibits no inverted nipple, no mass, no nipple discharge and no tenderness (no axillary adenopathy). Left breast exhibits no inverted nipple, no mass, no nipple discharge and no tenderness (no axilarry adenopathy).  Abdominal: Soft. Bowel sounds are normal. There is no tenderness.  Musculoskeletal: She exhibits no edema or tenderness.  Lymphadenopathy:    She has no cervical adenopathy.  Neurological: She is alert and oriented to person, place, and time.  Skin: No rash noted. No erythema.   Psychiatric: She has a normal mood and affect. Her behavior is normal.    BP 130/78 (BP Location: Left Arm, Patient Position: Sitting, Cuff Size: Normal)   Pulse 87   Temp 97.6 F (36.4 C) (Oral)   Resp 18   Ht _0  (1.575 m)   Wt 194 lb (88 kg)   LMP 07/16/2009   SpO2 97%   BMI 35.48 kg/m  Wt Readings from Last 3 Encounters:  05/24/18 194 lb (88 kg)  01/01/18 192 lb (87.1 kg)  10/09/17 191 lb 3.2 oz (86.7 kg)     Lab Results  Component Value Date   WBC 5.2 06/01/2017   HGB 13.0 06/01/2017   HCT 39.8 06/01/2017   PLT 337.0 06/01/2017   GLUCOSE 104 (H) 05/21/2018  CHOL 190 05/21/2018   TRIG 154.0 (H) 05/21/2018   HDL 48.20 05/21/2018   LDLDIRECT 139.0 10/05/2017   LDLCALC 111 (H) 05/21/2018   ALT 11 05/21/2018   AST 14 05/21/2018   NA 139 05/21/2018   K 4.2 05/21/2018   CL 103 05/21/2018   CREATININE 0.68 05/21/2018   BUN 13 05/21/2018   CO2 29 05/21/2018   TSH 3.81 06/01/2017   HGBA1C 5.6 05/21/2018    Mm 3d Screen Breast Bilateral  Result Date: 04/26/2018 CLINICAL DATA:  Screening. EXAM: DIGITAL SCREENING BILATERAL MAMMOGRAM WITH TOMO AND CAD COMPARISON:  Previous exam(s). ACR Breast Density Category b: There are scattered areas of fibroglandular density. FINDINGS: There are no findings suspicious for malignancy. Images were processed with CAD. IMPRESSION: No mammographic evidence of malignancy. A result letter of this screening mammogram will be mailed directly to the patient. RECOMMENDATION: Screening mammogram in one year. (Code:SM-B-01Y) BI-RADS CATEGORY  1: Negative. Electronically Signed   By: Dorise Bullion III M.D   On: 04/26/2018 11:42       Assessment & Plan:   Problem List Items Addressed This Visit    Health care maintenance    Physical today 05/24/18.  Mammogram 04/26/18 - Birads I.  Colonoscopy 05/26/17 (Dr Alice Reichert).  Hyperplastic polyp.  Recommended f/u in 5 years.    PAP 05/24/18.        Hypercholesterolemia    Discussed calculated  cholesterol risk.  Discussed starting cholesterol medication.  She declines.  Follow lipid panel.  Low cholesterol diet and exercise.        Relevant Orders   Hepatic function panel   Lipid panel   Hyperglycemia    Low carb diet and exercise.  Follow met b and a1c.        Relevant Orders   Hemoglobin A1c   Hypertension    Blood pressure under good control.  Continue same medication regimen.  Follow pressures.  Follow metabolic panel.        Relevant Orders   CBC with Differential/Platelet   TSH   Basic metabolic panel   Stress    Increased stress.  Overall she feels she is handling things relatively well. Follow.         Other Visit Diagnoses    Routine general medical examination at a health care facility    -  Primary   Cervical cancer screening       Relevant Orders   Cytology - PAP( [place])       Einar Pheasant, MD

## 2018-05-27 ENCOUNTER — Encounter: Payer: Self-pay | Admitting: Internal Medicine

## 2018-05-27 NOTE — Assessment & Plan Note (Signed)
Increased stress.  Overall she feels she is handling things relatively well.  Follow.   

## 2018-05-27 NOTE — Assessment & Plan Note (Signed)
Low carb diet and exercise.  Follow met b and a1c.   

## 2018-05-27 NOTE — Assessment & Plan Note (Signed)
Discussed calculated cholesterol risk.  Discussed starting cholesterol medication.  She declines.  Follow lipid panel.  Low cholesterol diet and exercise.

## 2018-05-27 NOTE — Assessment & Plan Note (Addendum)
Physical today 05/24/18.  Mammogram 04/26/18 - Birads I.  Colonoscopy 05/26/17 (Dr Alice Reichert).  Hyperplastic polyp.  Recommended f/u in 5 years.    PAP 05/24/18.

## 2018-05-27 NOTE — Assessment & Plan Note (Signed)
Blood pressure under good control.  Continue same medication regimen.  Follow pressures.  Follow metabolic panel.   

## 2018-05-29 LAB — CYTOLOGY - PAP
DIAGNOSIS: NEGATIVE
HPV: NOT DETECTED

## 2018-05-30 ENCOUNTER — Encounter: Payer: Self-pay | Admitting: Internal Medicine

## 2018-07-04 ENCOUNTER — Other Ambulatory Visit: Payer: Self-pay | Admitting: Internal Medicine

## 2018-10-22 ENCOUNTER — Ambulatory Visit: Payer: No Typology Code available for payment source | Admitting: Dietician

## 2018-10-25 ENCOUNTER — Encounter: Payer: No Typology Code available for payment source | Attending: Internal Medicine | Admitting: Dietician

## 2018-10-25 ENCOUNTER — Encounter: Payer: Self-pay | Admitting: Dietician

## 2018-10-25 ENCOUNTER — Other Ambulatory Visit: Payer: Self-pay

## 2018-10-25 VITALS — Ht 61.0 in | Wt 190.0 lb

## 2018-10-25 DIAGNOSIS — Z713 Dietary counseling and surveillance: Secondary | ICD-10-CM

## 2018-10-25 NOTE — Progress Notes (Signed)
[employer] Employee "self referral" nutrition session: Start time: 1315   End time: 0177  Height: 5'1" Weight: 190.0lbs  Met with employee to discuss his/her nutritional concerns and diet history.   Diet history: Patient reports some history of working on weight loss; most signifiant weight loss was when she has been able to exercise most. Currently working from home some days, but typically eats lunch out most workdays. Husband cooks supper most days, and prepares quick meals often with processed foods. She reports difficulty increasing fruit intake, does not like many fruits especially with meals. She does like most vegetables.   Typical eating pattern: Breakfast: biscuit; eggs with bacon or sausage; grits with ham; occ rotein shake + small tangerine (or later in day) Snack: none Lunch: social meal usu out 3-4x a week. Now bringing from home -- leftovers or sandwich, salad and soup Snack: rarely 3-4pm same as evening Supper: husband usu cooks -- hamburger helper, hot dogs, frozen pizza, "easy" meals -- not as hungry, usually smaller portions Snack: smart pop popcorn; mini candy bar, 1-2 cookies; trail mix raisins, choc pieces, nuts Beverages: water, diet iced tea   Education topics covered during this visit:  General nutrition/ Healthy eating  Exercise  Weight Concerns   Educational resources provided:  Museum/gallery conservator with food lists  Sample menus and/or recipes: [employer], [employer] a [employer]   Additional Comments:  Patient has good basic nutrition knowledge, and is working on portion control  She feels increasing exercise will help with weight control and overall wellbeing, but has difficulty with motivation for exercise.    Plan:   Control food portions, especially starches, using food lists and suggested servings as provided.   Include low-carb veggies with meals, fruit with snacks.  Increase physical activity and exercise as able.

## 2018-11-26 ENCOUNTER — Encounter: Payer: Self-pay | Admitting: Dietician

## 2018-11-26 NOTE — Progress Notes (Signed)
Ashley Waller has cancelled her MNT follow-up visit for 11/28/18, and will reschedule at a later time.

## 2018-11-28 ENCOUNTER — Ambulatory Visit: Payer: No Typology Code available for payment source | Admitting: Dietician

## 2018-11-28 ENCOUNTER — Other Ambulatory Visit (INDEPENDENT_AMBULATORY_CARE_PROVIDER_SITE_OTHER): Payer: No Typology Code available for payment source

## 2018-11-28 ENCOUNTER — Encounter: Payer: Self-pay | Admitting: Internal Medicine

## 2018-11-28 ENCOUNTER — Other Ambulatory Visit: Payer: Self-pay

## 2018-11-28 DIAGNOSIS — I10 Essential (primary) hypertension: Secondary | ICD-10-CM | POA: Diagnosis not present

## 2018-11-28 DIAGNOSIS — R739 Hyperglycemia, unspecified: Secondary | ICD-10-CM | POA: Diagnosis not present

## 2018-11-28 DIAGNOSIS — E7800 Pure hypercholesterolemia, unspecified: Secondary | ICD-10-CM | POA: Diagnosis not present

## 2018-11-28 LAB — LIPID PANEL
Cholesterol: 225 mg/dL — ABNORMAL HIGH (ref 0–200)
HDL: 50 mg/dL (ref >39.00)
NonHDL: 174.62
Total CHOL/HDL Ratio: 4
Triglycerides: 253 mg/dL — ABNORMAL HIGH (ref 0.0–149.0)
VLDL: 50.6 mg/dL — ABNORMAL HIGH (ref 0.0–40.0)

## 2018-11-28 LAB — CBC WITH DIFFERENTIAL/PLATELET
Basophils Absolute: 0 10*3/uL (ref 0.0–0.1)
Basophils Relative: 1 % (ref 0.0–3.0)
Eosinophils Absolute: 0.1 10*3/uL (ref 0.0–0.7)
Eosinophils Relative: 1.9 % (ref 0.0–5.0)
HCT: 38.9 % (ref 36.0–46.0)
Hemoglobin: 13.6 g/dL (ref 12.0–15.0)
Lymphocytes Relative: 37.7 % (ref 12.0–46.0)
Lymphs Abs: 1.9 10*3/uL (ref 0.7–4.0)
MCHC: 34.8 g/dL (ref 30.0–36.0)
MCV: 87.4 fl (ref 78.0–100.0)
Monocytes Absolute: 0.3 10*3/uL (ref 0.1–1.0)
Monocytes Relative: 6.5 % (ref 3.0–12.0)
Neutro Abs: 2.6 10*3/uL (ref 1.4–7.7)
Neutrophils Relative %: 52.9 % (ref 43.0–77.0)
Platelets: 303 10*3/uL (ref 150.0–400.0)
RBC: 4.45 Mil/uL (ref 3.87–5.11)
RDW: 12.7 % (ref 11.5–15.5)
WBC: 4.9 10*3/uL (ref 4.0–10.5)

## 2018-11-28 LAB — BASIC METABOLIC PANEL
BUN: 12 mg/dL (ref 6–23)
CO2: 27 mEq/L (ref 19–32)
Calcium: 8.9 mg/dL (ref 8.4–10.5)
Chloride: 102 mEq/L (ref 96–112)
Creatinine, Ser: 0.59 mg/dL (ref 0.40–1.20)
GFR: 103.88 mL/min (ref >60.00)
Glucose, Bld: 99 mg/dL (ref 70–99)
Potassium: 4 mEq/L (ref 3.5–5.1)
Sodium: 137 mEq/L (ref 135–145)

## 2018-11-28 LAB — HEPATIC FUNCTION PANEL
ALT: 12 U/L (ref 0–35)
AST: 15 U/L (ref 0–37)
Albumin: 4.2 g/dL (ref 3.5–5.2)
Alkaline Phosphatase: 69 U/L (ref 39–117)
Bilirubin, Direct: 0.1 mg/dL (ref 0.0–0.3)
Total Bilirubin: 0.4 mg/dL (ref 0.2–1.2)
Total Protein: 6.8 g/dL (ref 6.0–8.3)

## 2018-11-28 LAB — LDL CHOLESTEROL, DIRECT: Direct LDL: 143 mg/dL

## 2018-11-28 LAB — HEMOGLOBIN A1C: Hgb A1c MFr Bld: 5.5 % (ref 4.6–6.5)

## 2018-11-28 LAB — TSH: TSH: 3.41 u[IU]/mL (ref 0.35–4.50)

## 2018-11-29 ENCOUNTER — Ambulatory Visit (INDEPENDENT_AMBULATORY_CARE_PROVIDER_SITE_OTHER): Payer: No Typology Code available for payment source | Admitting: Internal Medicine

## 2018-11-29 ENCOUNTER — Encounter: Payer: Self-pay | Admitting: Internal Medicine

## 2018-11-29 DIAGNOSIS — I10 Essential (primary) hypertension: Secondary | ICD-10-CM

## 2018-11-29 DIAGNOSIS — R739 Hyperglycemia, unspecified: Secondary | ICD-10-CM

## 2018-11-29 DIAGNOSIS — F439 Reaction to severe stress, unspecified: Secondary | ICD-10-CM

## 2018-11-29 DIAGNOSIS — Z9109 Other allergy status, other than to drugs and biological substances: Secondary | ICD-10-CM | POA: Diagnosis not present

## 2018-11-29 DIAGNOSIS — E7800 Pure hypercholesterolemia, unspecified: Secondary | ICD-10-CM

## 2018-11-29 MED ORDER — SERTRALINE HCL 50 MG PO TABS: 50.0000 mg | ORAL_TABLET | Freq: Every day | ORAL | 1 refills | Status: DC

## 2018-11-29 NOTE — Assessment & Plan Note (Signed)
Working from home.  Overall she feels she is handling things relatively well.  Does not feel she needs any further intervention.  Follow.

## 2018-11-29 NOTE — Progress Notes (Signed)
Patient ID: Ashley Waller, female   DOB: Dec 30, 1960, 58 y.o.   MRN: [id number] Virtual Visit via Video Note  This visit type was conducted due to national recommendations for restrictions regarding the COVID-19 pandemic (e.g. social distancing).  This format is felt to be most appropriate for this patient at this time.  All issues noted in this document were discussed and addressed.  No physical exam was performed (except for noted visual exam findings with Video Visits).   I connected with [name] on 11/29/18 at  8:00 AM EDT by a video enabled telemedicine application and verified that I am speaking with the correct person using two identifiers. Location patient: home Location provider: work Persons participating in the virtual visit: patient, provider  I discussed the limitations, risks, security and privacy concerns of performing an evaluation and management service by video and the availability of in person appointments The patient expressed understanding and agreed to proceed.   Reason for visit: scheduled follow up.   HPI: She reports she is doing relatively well.  Working from home.  Handling stress well.  Not exercising as much.  She is walking some.  No chest pain.  No sob.  No acid reflux.  No abdominal pain.  Bowels moving. No urine change.  Was having some allergy issues.  Now taking zyrtec and this is under control.  Discussed labs.  Discussed elevated cholesterol.  Discussed diet and exercise.     ROS: See pertinent positives and negatives per HPI.  Past Medical History:  Diagnosis Date  . Abnormal mammogram 10/15/2012  . Adult BMI 35.0-35.9 kg/sq m 01/31/2017  . Arthritis   . Asthma   . Chest pain 03/13/2016  . Health care maintenance 11/30/2014   Mammogram 12/22/15 - Birads 0.  Recommended f/u right breat mammogram.  Follow up right breast mammogram 01/11/16 - Birads I.  Recommended f/u mammogram in one year.     . Hematuria   . Hemorrhoid   . History of colonic polyps  02/27/2015  . Hypercholesterolemia 07/16/2012  . Hypertension   . Menopausal syndrome 07/22/2012  . Plantar fasciitis of left foot 07/15/2013  . Stress 05/17/2014    Past Surgical History:  Procedure Laterality Date  . COLONOSCOPY  2013  . EYELID LACERATION REPAIR  2006  . TUBAL LIGATION  1995    Family History  Problem Relation Age of Onset  . Hyperlipidemia Mother   . Diabetes Mother   . Hypertension Mother   . Hyperlipidemia Father   . Diabetes Father   . Hypertension Father   . Mental illness Maternal Grandmother   . Mental illness Maternal Grandfather   . Mental illness Paternal Grandmother   . Mental illness Paternal Grandfather   . Breast cancer Neg Hx     SOCIAL HX: reviewed.    Current Outpatient Medications:  .  acetaminophen (TYLENOL) 325 MG tablet, Take 650 mg by mouth every 6 (six) hours as needed for pain., Disp: , Rfl:  .  albuterol (PROVENTIL HFA;VENTOLIN HFA) 108 (90 Base) MCG/ACT inhaler, Inhale 2 puffs into the lungs every 6 (six) hours as needed for wheezing or shortness of breath., Disp: 1 Inhaler, Rfl: 0 .  aspirin 81 MG tablet, Take 81 mg by mouth daily., Disp: , Rfl:  .  Cetirizine HCl (ZYRTEC PO), Take by mouth., Disp: , Rfl:  .  fluticasone (FLONASE) 50 MCG/ACT nasal spray, Place 2 sprays into both nostrils daily. Hold until pt request, Disp: 48 g, Rfl: 1 .  sertraline (ZOLOFT) 50 MG tablet, Take 1 tablet (50 mg total) by mouth daily., Disp: 90 tablet, Rfl: 1  EXAM:  GENERAL: alert, oriented, appears well and in no acute distress  HEENT: atraumatic, conjunttiva clear, no obvious abnormalities on inspection of external nose and ears  NECK: normal movements of the head and neck  LUNGS: on inspection no signs of respiratory distress, breathing rate appears normal, no obvious gross SOB, gasping or wheezing  CV: no obvious cyanosis  PSYCH/NEURO: pleasant and cooperative, no obvious depression or anxiety, speech and thought processing grossly  intact  ASSESSMENT AND PLAN:  Discussed the following assessment and plan:  Environmental allergies - Controlled on zyrtec.  Follow.    Hypercholesterolemia - Plan: Hepatic function panel, Lipid panel  Hyperglycemia - Plan: Hemoglobin A1c  Essential hypertension - Plan: Basic metabolic panel  Stress  Hypercholesterolemia Discussed cholesterol labs.  Discussed low cholesterol diet and exercise.  Follow lipid panel.    Hyperglycemia Low carb diet and exercise.  Follow met b and a1c.    Hypertension Not checking her pressures, but feels they are doing well.  States she has had no issues.  Follow pressures.    Stress Working from home.  Overall she feels she is handling things relatively well.  Does not feel she needs any further intervention.  Follow.      I discussed the assessment and treatment plan with the patient. The patient was provided an opportunity to ask questions and all were answered. The patient agreed with the plan and demonstrated an understanding of the instructions.   The patient was advised to call back or seek an in-person evaluation if the symptoms worsen or if the condition fails to improve as anticipated.   Einar Pheasant, MD

## 2018-11-29 NOTE — Assessment & Plan Note (Signed)
Not checking her pressures, but feels they are doing well.  States she has had no issues.  Follow pressures.

## 2018-11-29 NOTE — Assessment & Plan Note (Signed)
Low carb diet and exercise.  Follow met b and a1c.   

## 2018-11-29 NOTE — Assessment & Plan Note (Signed)
Discussed cholesterol labs.  Discussed low cholesterol diet and exercise.  Follow lipid panel.

## 2018-12-31 ENCOUNTER — Encounter: Payer: No Typology Code available for payment source | Attending: Internal Medicine | Admitting: Dietician

## 2018-12-31 ENCOUNTER — Other Ambulatory Visit: Payer: Self-pay

## 2018-12-31 ENCOUNTER — Encounter: Payer: Self-pay | Admitting: Dietician

## 2018-12-31 VITALS — Ht 61.0 in | Wt 192.9 lb

## 2018-12-31 DIAGNOSIS — Z713 Dietary counseling and surveillance: Secondary | ICD-10-CM

## 2018-12-31 NOTE — Patient Instructions (Signed)
   Start keeping a food journal to increase awareness and monitor intake.   Continue to control food portions, particularly starchy foods.   Limit extra snacking and "boredom" eating by engaging in crafts, hobbies, etc to occupy thoughts and hands.   Gradually increase physical activity.

## 2018-12-31 NOTE — Progress Notes (Signed)
[employer] Employee "self referral" nutrition session: Start time: 1105   End time: 0932  Height: 5'1" Weight: 192.9lbs  Met with employee to discuss his/her nutritional concerns and diet history.   Progress:   Reports working on eating smaller amounts more often.  She feels she needs to continue to reduce some food portions and snack less frequently.   Some physcial activity with gardening, walking to mailbox (long driveway), but no much other exercise.  Typical eating pattern: Breakfast: 2 mini sausage biscuits + tangerine Snack: popcorn or fruit Lunch: 1pm-- at home recently -- leftovers or sandwich Snack: usually none Supper: 5-6pm-- patient cooking more fresh meat ie chicken + veg squash, broccoli, frozen veg (in sauce); occ pizza or other restaurant meal Snack: small amount candy or smart pop popcorn (measured portion) Beverages: water, diet tea  Physical Activity: some gardening and household activity  Education topics covered during this visit:  General nutrition/ Healthy eating  Exercise  Weight Concerns   Educational resources provided: Food Diary sheets AVS  Plan:  Updated goals with input from patient  Next appointment scheduled for 02/04/19

## 2019-02-04 ENCOUNTER — Other Ambulatory Visit: Payer: Self-pay

## 2019-02-04 ENCOUNTER — Encounter: Payer: No Typology Code available for payment source | Attending: Internal Medicine | Admitting: Dietician

## 2019-02-04 ENCOUNTER — Encounter: Payer: Self-pay | Admitting: Dietician

## 2019-02-04 VITALS — Ht 61.0 in | Wt 194.5 lb

## 2019-02-04 DIAGNOSIS — Z713 Dietary counseling and surveillance: Secondary | ICD-10-CM

## 2019-02-04 NOTE — Patient Instructions (Signed)
   Pre-prep fresh vegetables and fruits to make them more convenient. Also keep water in the fridge to have it cold and ready to drink.  Use small plates more often to help with portion control.   Increase vegetable portions for fulness, and to occupy more of a meal plate, so making it easier to reduce starch portions.   Look for sugar free ("zero sugar") Tang to reduce liquid calories (which don't keep [place] full).

## 2019-02-04 NOTE — Progress Notes (Signed)
[facility] Employee "self referral" nutrition session: Start time: 1100   End time: 1135  Height: 5'1" Weight: 194.5lbs  Met with employee to discuss his/her nutritional concerns and diet history.   Diet history:   Patient reports having difficulty implementing changes  She did keep a food diary for several days, states it helped increase awareness of some habits including beverage intake and snacking.    Typical eating pattern: Breakfast: 2 mini sausage biscuits; bagel with flavored cream cheese; pancakes and sausage, bacon, egg, and cheese biscuit Snack: none Lunch: ham and cheese sandwich, sometimes with chips; leftovers; burger and fries Snack: none or popcorn; less fruit recently  Supper: chicken with rice and veg stir-fry; frozen pizza; hamburger helper; chicken and pasta salad; imitation crab and pasta salad Snack: none or popcorn; donut stick; chips Beverages: diet flavored tea; Tang (not diet); some sweet tea   Education topics covered during this visit:  General nutrition/ Healthy eating  Exercise  Weight Concerns   Educational resources provided:  (Diabetes-friendly) menus and recipes  After-visit summary with nutrition goals   Additional Comments:  Patient would like to feel better and healthier, even if she does not lose weight.   Discussed making small and/or step-wise changes to avoid subconsciously feeling overwhelmed.   Discussed positive changes that would be easy to implement.   Plan:   Patient to work on goals as listing in Patient Instructions.  Patient to call with any questions or concerns.  Patient to obtain MD referral if additional MNT is needed.

## 2019-02-05 ENCOUNTER — Encounter: Payer: Self-pay | Admitting: Internal Medicine

## 2019-02-06 NOTE — Telephone Encounter (Signed)
Left message for patient to call back and schedule a doxy to discuss. Looks like she has been off of medication since 2016

## 2019-02-08 ENCOUNTER — Other Ambulatory Visit: Payer: Self-pay

## 2019-02-08 ENCOUNTER — Ambulatory Visit (INDEPENDENT_AMBULATORY_CARE_PROVIDER_SITE_OTHER): Payer: No Typology Code available for payment source | Admitting: Internal Medicine

## 2019-02-08 ENCOUNTER — Encounter: Payer: Self-pay | Admitting: Internal Medicine

## 2019-02-08 DIAGNOSIS — R739 Hyperglycemia, unspecified: Secondary | ICD-10-CM | POA: Diagnosis not present

## 2019-02-08 DIAGNOSIS — E7800 Pure hypercholesterolemia, unspecified: Secondary | ICD-10-CM | POA: Diagnosis not present

## 2019-02-08 DIAGNOSIS — I10 Essential (primary) hypertension: Secondary | ICD-10-CM | POA: Diagnosis not present

## 2019-02-08 DIAGNOSIS — F439 Reaction to severe stress, unspecified: Secondary | ICD-10-CM

## 2019-02-08 MED ORDER — AMLODIPINE BESYLATE 5 MG PO TABS: 5.0000 mg | ORAL_TABLET | Freq: Every day | ORAL | 1 refills | Status: DC

## 2019-02-08 NOTE — Progress Notes (Signed)
Patient ID: Ashley Waller, female   DOB: 10-08-1960, 58 y.o.   MRN: [id number]   Virtual Visit via video Note  This visit type was conducted due to national recommendations for restrictions regarding the COVID-19 pandemic (e.g. social distancing).  This format is felt to be most appropriate for this patient at this time.  All issues noted in this document were discussed and addressed.  No physical exam was performed (except for noted visual exam findings with Video Visits).   I connected with [name] by telephone and verified that I am speaking with the correct person using two identifiers. Location patient: home Location provider: work Persons participating in the telephone visit: patient, provider  I discussed the limitations, risks, security and privacy concerns of performing an evaluation and management service by video and the availability of in person appointments.  The patient expressed understanding and agreed to proceed.   Reason for visit: acute  HPI: Work in appt for elevated blood pressure.  On no blood pressure medication now.  Previously was on hctz.  States this past weekend, just noticed head did not feel right.  States she has had this sensation previously when blood pressure elevated.  No headache.  No dizziness.  Has been check her blood pressure and has been averaging 140-150/90-low 100s.  No chest pain.  No sob.  Increased stress.  Trying to stay in due to covid restrictions.  Her brother is flying in from [place]. Increased stress related to this.  Discussed self quarantine.  Increased stress with work.  Overall she feels she is handling things relatively well.     ROS: See pertinent positives and negatives per HPI.  Past Medical History:  Diagnosis Date  . Abnormal mammogram 10/15/2012  . Adult BMI 35.0-35.9 kg/sq m 01/31/2017  . Arthritis   . Asthma   . Chest pain 03/13/2016  . Health care maintenance 11/30/2014   Mammogram 12/22/15 - Birads 0.  Recommended f/u right  breat mammogram.  Follow up right breast mammogram 01/11/16 - Birads I.  Recommended f/u mammogram in one year.     . Hematuria   . Hemorrhoid   . History of colonic polyps 02/27/2015  . Hypercholesterolemia 07/16/2012  . Hypertension   . Menopausal syndrome 07/22/2012  . Plantar fasciitis of left foot 07/15/2013  . Stress 05/17/2014    Past Surgical History:  Procedure Laterality Date  . COLONOSCOPY  2013  . EYELID LACERATION REPAIR  2006  . TUBAL LIGATION  1995    Family History  Problem Relation Age of Onset  . Hyperlipidemia Mother   . Diabetes Mother   . Hypertension Mother   . Hyperlipidemia Father   . Diabetes Father   . Hypertension Father   . Mental illness Maternal Grandmother   . Mental illness Maternal Grandfather   . Mental illness Paternal Grandmother   . Mental illness Paternal Grandfather   . Breast cancer Neg Hx     SOCIAL HX: reviewed.    Current Outpatient Medications:  .  acetaminophen (TYLENOL) 325 MG tablet, Take 650 mg by mouth every 6 (six) hours as needed for pain., Disp: , Rfl:  .  albuterol (PROVENTIL HFA;VENTOLIN HFA) 108 (90 Base) MCG/ACT inhaler, Inhale 2 puffs into the lungs every 6 (six) hours as needed for wheezing or shortness of breath., Disp: 1 Inhaler, Rfl: 0 .  aspirin 81 MG tablet, Take 81 mg by mouth daily., Disp: , Rfl:  .  Cetirizine HCl (ZYRTEC PO), Take by mouth.,  Disp: , Rfl:  .  fluticasone (FLONASE) 50 MCG/ACT nasal spray, Place 2 sprays into both nostrils daily. Hold until pt request, Disp: 48 g, Rfl: 1 .  sertraline (ZOLOFT) 50 MG tablet, Take 1 tablet (50 mg total) by mouth daily., Disp: 90 tablet, Rfl: 1 .  amLODipine (NORVASC) 5 MG tablet, Take 1 tablet (5 mg total) by mouth daily., Disp: 90 tablet, Rfl: 1  EXAM:  VITALS per patient if applicable: 536/46 this am.  Recheck 134/105.   GENERAL: alert, oriented, appears well and in no acute distress  HEENT: atraumatic, conjunttiva clear, no obvious abnormalities on  inspection of external nose and ears  NECK: normal movements of the head and neck  LUNGS: on inspection no signs of respiratory distress, breathing rate appears normal, no obvious gross SOB, gasping or wheezing  CV: no obvious cyanosis  PSYCH/NEURO: pleasant and cooperative, no obvious depression or anxiety, speech and thought processing grossly intact  ASSESSMENT AND PLAN:  Discussed the following assessment and plan:  Hypercholesterolemia Low cholesterol diet and exercise.  Follow lipid panel.    Hyperglycemia Low carb diet and exercise.  Follow met b and a1c.   Hypertension Persistent elevation in blood pressure as outlined.  No headache.  No "funny headed" sensation now.  Start amlodipine 42m q day.  Follow pressures.  Follow metabolic panel.  Send in readings over the next few weeks.    Stress Increased stress as outlined.  Discussed with her today.  She does not feel needs anything more at this time.  Follow.      I discussed the assessment and treatment plan with the patient. The patient was provided an opportunity to ask questions and all were answered. The patient agreed with the plan and demonstrated an understanding of the instructions.   The patient was advised to call back or seek an in-person evaluation if the symptoms worsen or if the condition fails to improve as anticipated.  I provided 30 minutes of non face to face time during this encounter.     CEinar Pheasant MD

## 2019-02-11 ENCOUNTER — Encounter: Payer: Self-pay | Admitting: Internal Medicine

## 2019-02-11 NOTE — Assessment & Plan Note (Signed)
Low cholesterol diet and exercise.  Follow lipid panel.   

## 2019-02-11 NOTE — Assessment & Plan Note (Signed)
Persistent elevation in blood pressure as outlined.  No headache.  No "funny headed" sensation now.  Start amlodipine 5mg  q day.  Follow pressures.  Follow metabolic panel.  Send in readings over the next few weeks.

## 2019-02-11 NOTE — Assessment & Plan Note (Signed)
Increased stress as outlined.  Discussed with her today.  She does not feel needs anything more at this time.  Follow.   

## 2019-02-11 NOTE — Assessment & Plan Note (Signed)
Low carb diet and exercise.  Follow met b and a1c.  

## 2019-02-20 ENCOUNTER — Encounter: Payer: Self-pay | Admitting: Internal Medicine

## 2019-04-03 ENCOUNTER — Other Ambulatory Visit (INDEPENDENT_AMBULATORY_CARE_PROVIDER_SITE_OTHER): Payer: No Typology Code available for payment source

## 2019-04-03 ENCOUNTER — Encounter: Payer: Self-pay | Admitting: Internal Medicine

## 2019-04-03 ENCOUNTER — Other Ambulatory Visit: Payer: Self-pay

## 2019-04-03 DIAGNOSIS — R739 Hyperglycemia, unspecified: Secondary | ICD-10-CM

## 2019-04-03 DIAGNOSIS — E7800 Pure hypercholesterolemia, unspecified: Secondary | ICD-10-CM

## 2019-04-03 DIAGNOSIS — I10 Essential (primary) hypertension: Secondary | ICD-10-CM | POA: Diagnosis not present

## 2019-04-03 LAB — HEPATIC FUNCTION PANEL
ALT: 11 U/L (ref 0–35)
AST: 14 U/L (ref 0–37)
Albumin: 4.2 g/dL (ref 3.5–5.2)
Alkaline Phosphatase: 75 U/L (ref 39–117)
Bilirubin, Direct: 0 mg/dL (ref 0.0–0.3)
Total Bilirubin: 0.5 mg/dL (ref 0.2–1.2)
Total Protein: 6.9 g/dL (ref 6.0–8.3)

## 2019-04-03 LAB — BASIC METABOLIC PANEL
BUN: 10 mg/dL (ref 6–23)
CO2: 29 mEq/L (ref 19–32)
Calcium: 9.3 mg/dL (ref 8.4–10.5)
Chloride: 102 mEq/L (ref 96–112)
Creatinine, Ser: 0.65 mg/dL (ref 0.40–1.20)
GFR: 92.79 mL/min (ref >60.00)
Glucose, Bld: 113 mg/dL — ABNORMAL HIGH (ref 70–99)
Potassium: 4.6 mEq/L (ref 3.5–5.1)
Sodium: 138 mEq/L (ref 135–145)

## 2019-04-03 LAB — LIPID PANEL
Cholesterol: 218 mg/dL — ABNORMAL HIGH (ref 0–200)
HDL: 50.6 mg/dL (ref >39.00)
NonHDL: 167.21
Total CHOL/HDL Ratio: 4
Triglycerides: 207 mg/dL — ABNORMAL HIGH (ref 0.0–149.0)
VLDL: 41.4 mg/dL — ABNORMAL HIGH (ref 0.0–40.0)

## 2019-04-03 LAB — HEMOGLOBIN A1C: Hgb A1c MFr Bld: 5.6 % (ref 4.6–6.5)

## 2019-04-03 LAB — LDL CHOLESTEROL, DIRECT: Direct LDL: 145 mg/dL

## 2019-04-04 ENCOUNTER — Other Ambulatory Visit: Payer: Self-pay

## 2019-04-05 ENCOUNTER — Ambulatory Visit (INDEPENDENT_AMBULATORY_CARE_PROVIDER_SITE_OTHER): Payer: No Typology Code available for payment source | Admitting: Internal Medicine

## 2019-04-05 ENCOUNTER — Encounter: Payer: Self-pay | Admitting: Internal Medicine

## 2019-04-05 VITALS — BP 117/79 | Ht 61.0 in | Wt 194.0 lb

## 2019-04-05 DIAGNOSIS — R739 Hyperglycemia, unspecified: Secondary | ICD-10-CM | POA: Diagnosis not present

## 2019-04-05 DIAGNOSIS — I10 Essential (primary) hypertension: Secondary | ICD-10-CM | POA: Diagnosis not present

## 2019-04-05 DIAGNOSIS — E7800 Pure hypercholesterolemia, unspecified: Secondary | ICD-10-CM | POA: Diagnosis not present

## 2019-04-05 DIAGNOSIS — F439 Reaction to severe stress, unspecified: Secondary | ICD-10-CM | POA: Diagnosis not present

## 2019-04-05 DIAGNOSIS — Z1239 Encounter for other screening for malignant neoplasm of breast: Secondary | ICD-10-CM

## 2019-04-05 NOTE — Progress Notes (Signed)
Patient ID: Ashley Waller, female   DOB: November 17, 1960, 58 y.o.   MRN: [id number]   Virtual Visit via video Note  This visit type was conducted due to national recommendations for restrictions regarding the COVID-19 pandemic (e.g. social distancing).  This format is felt to be most appropriate for this patient at this time.  All issues noted in this document were discussed and addressed.  No physical exam was performed (except for noted visual exam findings with Video Visits).   I connected with Ellana Kawa by a video enabled telemedicine application and verified that I am speaking with the correct person using two identifiers. Location patient: home Location provider: work Persons participating in the virtual visit: patient, provider  I discussed the limitations, risks, security and privacy concerns of performing an evaluation and management service by video and the availability of in person appointments. The patient expressed understanding and agreed to proceed.   Reason for visit: scheduled follow up.   HPI: She reports she is doing relatively well.  Brother in state visiting from [place].  Doing well. Handling stress.  Trying to stay active.  No chest pain.  No sob.  No acid reflux.  No abdominal pain.  Bowels moving.  Discussed labs.  a1c 5.6.  Had colonoscopy 05/2017.  Recommended f/u in 5 years.     ROS: See pertinent positives and negatives per HPI.  Past Medical History:  Diagnosis Date  . Abnormal mammogram 10/15/2012  . Adult BMI 35.0-35.9 kg/sq m 01/31/2017  . Arthritis   . Asthma   . Chest pain 03/13/2016  . Health care maintenance 11/30/2014   Mammogram 12/22/15 - Birads 0.  Recommended f/u right breat mammogram.  Follow up right breast mammogram 01/11/16 - Birads I.  Recommended f/u mammogram in one year.     . Hematuria   . Hemorrhoid   . History of colonic polyps 02/27/2015  . Hypercholesterolemia 07/16/2012  . Hypertension   . Menopausal syndrome 07/22/2012  . Plantar  fasciitis of left foot 07/15/2013  . Stress 05/17/2014    Past Surgical History:  Procedure Laterality Date  . COLONOSCOPY  2013  . EYELID LACERATION REPAIR  2006  . TUBAL LIGATION  1995    Family History  Problem Relation Age of Onset  . Hyperlipidemia Mother   . Diabetes Mother   . Hypertension Mother   . Hyperlipidemia Father   . Diabetes Father   . Hypertension Father   . Mental illness Maternal Grandmother   . Mental illness Maternal Grandfather   . Mental illness Paternal Grandmother   . Mental illness Paternal Grandfather   . Breast cancer Neg Hx     SOCIAL HX: reviewed.     Current Outpatient Medications:  .  acetaminophen (TYLENOL) 325 MG tablet, Take 650 mg by mouth every 6 (six) hours as needed for pain., Disp: , Rfl:  .  albuterol (PROVENTIL HFA;VENTOLIN HFA) 108 (90 Base) MCG/ACT inhaler, Inhale 2 puffs into the lungs every 6 (six) hours as needed for wheezing or shortness of breath., Disp: 1 Inhaler, Rfl: 0 .  amLODipine (NORVASC) 5 MG tablet, Take 1 tablet (5 mg total) by mouth daily., Disp: 90 tablet, Rfl: 1 .  aspirin 81 MG tablet, Take 81 mg by mouth daily., Disp: , Rfl:  .  Cetirizine HCl (ZYRTEC PO), Take by mouth., Disp: , Rfl:  .  fluticasone (FLONASE) 50 MCG/ACT nasal spray, Place 2 sprays into both nostrils daily. Hold until pt request, Disp: 48 g, Rfl:  1 .  sertraline (ZOLOFT) 50 MG tablet, Take 1 tablet (50 mg total) by mouth daily., Disp: 90 tablet, Rfl: 1  EXAM:  VITALS per patient if applicable:  383/29  GENERAL: alert, oriented, appears well and in no acute distress  HEENT: atraumatic, conjunttiva clear, no obvious abnormalities on inspection of external nose and ears  NECK: normal movements of the head and neck  LUNGS: on inspection no signs of respiratory distress, breathing rate appears normal, no obvious gross SOB, gasping or wheezing  CV: no obvious cyanosis  PSYCH/NEURO: pleasant and cooperative, no obvious depression or anxiety,  speech and thought processing grossly intact  ASSESSMENT AND PLAN:  Discussed the following assessment and plan:  Hypercholesterolemia Calculated cholesterol risk 5.2%.  Discussed low cholesterol diet and exercise.  Follow lipid panel.   Hyperglycemia Low carb diet and exercise.  Follow met b and a1c.    Hypertension Blood pressure under good control.  Continue same medication regimen.  Follow pressures.  Follow metabolic panel.    Stress Increased stress.  Discussed with her today.  Overall she feels she is handling things relatively well.  Does not feel needs any further intervention.  Follow.      I discussed the assessment and treatment plan with the patient. The patient was provided an opportunity to ask questions and all were answered. The patient agreed with the plan and demonstrated an understanding of the instructions.   The patient was advised to call back or seek an in-person evaluation if the symptoms worsen or if the condition fails to improve as anticipated.   Einar Pheasant, MD

## 2019-04-08 ENCOUNTER — Encounter: Payer: Self-pay | Admitting: Internal Medicine

## 2019-04-08 NOTE — Assessment & Plan Note (Signed)
Calculated cholesterol risk 5.2%.  Discussed low cholesterol diet and exercise.  Follow lipid panel.

## 2019-04-08 NOTE — Assessment & Plan Note (Signed)
Blood pressure under good control.  Continue same medication regimen.  Follow pressures.  Follow metabolic panel.   

## 2019-04-08 NOTE — Assessment & Plan Note (Signed)
Low carb diet and exercise.  Follow met b and a1c.   

## 2019-04-08 NOTE — Assessment & Plan Note (Signed)
Increased stress.  Discussed with her today.  Overall she feels she is handling things relatively well.  Does not feel needs any further intervention.  Follow.

## 2019-05-02 ENCOUNTER — Encounter: Payer: Self-pay | Admitting: Internal Medicine

## 2019-05-09 ENCOUNTER — Other Ambulatory Visit: Payer: Self-pay

## 2019-05-09 ENCOUNTER — Ambulatory Visit
Admission: RE | Admit: 2019-05-09 | Discharge: 2019-05-09 | Disposition: A | Discharge status: Home / Self Care | Payer: No Typology Code available for payment source | Source: Ambulatory Visit | Attending: Internal Medicine | Admitting: Internal Medicine

## 2019-05-09 DIAGNOSIS — Z1231 Encounter for screening mammogram for malignant neoplasm of breast: Secondary | ICD-10-CM | POA: Insufficient documentation

## 2019-05-09 DIAGNOSIS — Z1239 Encounter for other screening for malignant neoplasm of breast: Secondary | ICD-10-CM | POA: Diagnosis present

## 2019-08-19 ENCOUNTER — Telehealth: Payer: Self-pay | Admitting: Internal Medicine

## 2019-08-19 ENCOUNTER — Other Ambulatory Visit: Payer: Self-pay

## 2019-08-19 NOTE — Telephone Encounter (Signed)
Pt is having her 1st covid vaccine today, (08/19/2019). She has a lab appt. On 08/21/2019 and wanted to know if she should still do her labs. Please advise patient.

## 2019-08-19 NOTE — Telephone Encounter (Signed)
I am ok if she has her labs drawn on 08/21/19.

## 2019-08-19 NOTE — Telephone Encounter (Signed)
Just wanted to double check with you before I told her this was ok. I have not heard anything about patients not being able to have labs drawn after.

## 2019-08-19 NOTE — Telephone Encounter (Signed)
Pt aware.

## 2019-08-21 ENCOUNTER — Other Ambulatory Visit (INDEPENDENT_AMBULATORY_CARE_PROVIDER_SITE_OTHER): Payer: No Typology Code available for payment source

## 2019-08-21 ENCOUNTER — Other Ambulatory Visit: Payer: Self-pay

## 2019-08-21 DIAGNOSIS — R739 Hyperglycemia, unspecified: Secondary | ICD-10-CM | POA: Diagnosis not present

## 2019-08-21 DIAGNOSIS — E7800 Pure hypercholesterolemia, unspecified: Secondary | ICD-10-CM | POA: Diagnosis not present

## 2019-08-21 DIAGNOSIS — I10 Essential (primary) hypertension: Secondary | ICD-10-CM | POA: Diagnosis not present

## 2019-08-21 LAB — HEPATIC FUNCTION PANEL
ALT: 12 U/L (ref 0–35)
AST: 16 U/L (ref 0–37)
Albumin: 4.3 g/dL (ref 3.5–5.2)
Alkaline Phosphatase: 80 U/L (ref 39–117)
Bilirubin, Direct: 0.1 mg/dL (ref 0.0–0.3)
Total Bilirubin: 0.6 mg/dL (ref 0.2–1.2)
Total Protein: 6.8 g/dL (ref 6.0–8.3)

## 2019-08-21 LAB — BASIC METABOLIC PANEL
BUN: 10 mg/dL (ref 6–23)
CO2: 28 mEq/L (ref 19–32)
Calcium: 9.5 mg/dL (ref 8.4–10.5)
Chloride: 102 mEq/L (ref 96–112)
Creatinine, Ser: 0.59 mg/dL (ref 0.40–1.20)
GFR: 103.63 mL/min (ref >60.00)
Glucose, Bld: 100 mg/dL — ABNORMAL HIGH (ref 70–99)
Potassium: 4 mEq/L (ref 3.5–5.1)
Sodium: 138 mEq/L (ref 135–145)

## 2019-08-21 LAB — LIPID PANEL
Cholesterol: 186 mg/dL (ref 0–200)
HDL: 46 mg/dL (ref >39.00)
LDL Cholesterol: 109 mg/dL — ABNORMAL HIGH (ref 0–99)
NonHDL: 139.63
Total CHOL/HDL Ratio: 4
Triglycerides: 152 mg/dL — ABNORMAL HIGH (ref 0.0–149.0)
VLDL: 30.4 mg/dL (ref 0.0–40.0)

## 2019-08-21 LAB — HEMOGLOBIN A1C: Hgb A1c MFr Bld: 5.3 % (ref 4.6–6.5)

## 2019-08-23 ENCOUNTER — Ambulatory Visit (INDEPENDENT_AMBULATORY_CARE_PROVIDER_SITE_OTHER): Payer: No Typology Code available for payment source | Admitting: Internal Medicine

## 2019-08-23 ENCOUNTER — Other Ambulatory Visit: Payer: Self-pay

## 2019-08-23 ENCOUNTER — Telehealth: Payer: Self-pay | Admitting: Internal Medicine

## 2019-08-23 VITALS — BP 117/79 | Ht 61.0 in | Wt 189.0 lb

## 2019-08-23 DIAGNOSIS — E7800 Pure hypercholesterolemia, unspecified: Secondary | ICD-10-CM | POA: Diagnosis not present

## 2019-08-23 DIAGNOSIS — R739 Hyperglycemia, unspecified: Secondary | ICD-10-CM | POA: Diagnosis not present

## 2019-08-23 DIAGNOSIS — Z86010 Personal history of colonic polyps: Secondary | ICD-10-CM | POA: Diagnosis not present

## 2019-08-23 DIAGNOSIS — I10 Essential (primary) hypertension: Secondary | ICD-10-CM | POA: Diagnosis not present

## 2019-08-23 DIAGNOSIS — F439 Reaction to severe stress, unspecified: Secondary | ICD-10-CM

## 2019-08-23 MED ORDER — AMLODIPINE BESYLATE 5 MG PO TABS: 5.0000 mg | ORAL_TABLET | Freq: Every day | ORAL | 1 refills | Status: DC

## 2019-08-23 MED ORDER — SERTRALINE HCL 50 MG PO TABS: 50.0000 mg | ORAL_TABLET | Freq: Every day | ORAL | 1 refills | Status: DC

## 2019-08-23 NOTE — Telephone Encounter (Signed)
Lm to call office and schedule a physical in 4 months and labs 1-2 days before physical.

## 2019-08-23 NOTE — Progress Notes (Signed)
Virtual Visit via video Note  This visit type was conducted due to national recommendations for restrictions regarding the COVID-19 pandemic (e.g. social distancing).  This format is felt to be most appropriate for this patient at this time.  All issues noted in this document were discussed and addressed.  No physical exam was performed (except for noted visual exam findings with Video Visits).   I connected with Ashley Waller by a video enabled telemedicine application and verified that I am speaking with the correct person using two identifiers. Location patient: home Location provider: work Persons participating in the virtual visit: patient, provider  The limitations, risks, security and privacy concerns of performing an evaluation and management service by video and the availability of in person appointments have been discussed.  The patient expressed understanding and agreed to proceed.  Reason for visit: scheduled follow up.   HPI: She reports she is doing relatively well.  Trying to stay in due to covid restrictions.  Did get the first covid vaccine this week.  Maybe some fatigue with the vaccine, but doing fine now.  Trying to stay active around the house.  No chest pain or sob reported.  No abdominal pain or bowel change reported.  Blood pressure doing well.  Handling stress.  Discussed labs.  Triglycerides and sugar improved.     ROS: See pertinent positives and negatives per HPI.  Past Medical History:  Diagnosis Date  . Abnormal mammogram 10/15/2012  . Adult BMI 35.0-35.9 kg/sq m 01/31/2017  . Arthritis   . Asthma   . Chest pain 03/13/2016  . Health care maintenance 11/30/2014   Mammogram 12/22/15 - Birads 0.  Recommended f/u right breat mammogram.  Follow up right breast mammogram 01/11/16 - Birads I.  Recommended f/u mammogram in one year.     . Hematuria   . Hemorrhoid   . History of colonic polyps 02/27/2015  . Hypercholesterolemia 07/16/2012  . Hypertension   . Menopausal  syndrome 07/22/2012  . Plantar fasciitis of left foot 07/15/2013  . Stress 05/17/2014    Past Surgical History:  Procedure Laterality Date  . COLONOSCOPY  2013  . EYELID LACERATION REPAIR  2006  . TUBAL LIGATION  1995    Family History  Problem Relation Age of Onset  . Hyperlipidemia Mother   . Diabetes Mother   . Hypertension Mother   . Hyperlipidemia Father   . Diabetes Father   . Hypertension Father   . Mental illness Maternal Grandmother   . Mental illness Maternal Grandfather   . Mental illness Paternal Grandmother   . Mental illness Paternal Grandfather   . Breast cancer Neg Hx     SOCIAL HX: reviewed.    Current Outpatient Medications:  .  acetaminophen (TYLENOL) 325 MG tablet, Take 650 mg by mouth every 6 (six) hours as needed for pain., Disp: , Rfl:  .  albuterol (PROVENTIL HFA;VENTOLIN HFA) 108 (90 Base) MCG/ACT inhaler, Inhale 2 puffs into the lungs every 6 (six) hours as needed for wheezing or shortness of breath., Disp: 1 Inhaler, Rfl: 0 .  amLODipine (NORVASC) 5 MG tablet, Take 1 tablet (5 mg total) by mouth daily., Disp: 90 tablet, Rfl: 1 .  aspirin 81 MG tablet, Take 81 mg by mouth daily., Disp: , Rfl:  .  Cetirizine HCl (ZYRTEC PO), Take by mouth., Disp: , Rfl:  .  fluticasone (FLONASE) 50 MCG/ACT nasal spray, Place 2 sprays into both nostrils daily. Hold until pt request, Disp: 48 g, Rfl:  1 .  sertraline (ZOLOFT) 50 MG tablet, Take 1 tablet (50 mg total) by mouth daily., Disp: 90 tablet, Rfl: 1  EXAM:  VITALS per patient if applicable: 175/10  GENERAL: alert, oriented, appears well and in no acute distress  HEENT: atraumatic, conjunttiva clear, no obvious abnormalities on inspection of external nose and ears  NECK: normal movements of the head and neck  LUNGS: on inspection no signs of respiratory distress, breathing rate appears normal, no obvious gross SOB, gasping or wheezing  CV: no obvious cyanosis  PSYCH/NEURO: pleasant and cooperative, no  obvious depression or anxiety, speech and thought processing grossly intact  ASSESSMENT AND PLAN:  Discussed the following assessment and plan:  Hypercholesterolemia Low cholesterol diet and exercise.  Follow lipid panel.   History of colonic polyps Colonoscopy 05/2017 - hyperplastic polyp.  Recommended f/u in 5 years.  (Dr Alice Reichert)  Hyperglycemia Low carb diet and exercise.  Follow met b and a1c.    Hypertension Blood pressure under good control.  Continue same medication regimen.  Follow pressures.  Follow metabolic panel.    Stress She feels she is handling things well.  On zoloft.  Stable. Follow.     Orders Placed This Encounter  Procedures  . CBC with Differential/Platelet    Standing Status:   Future    Standing Expiration Date:   08/22/2020  . Hemoglobin A1c    Standing Status:   Future    Standing Expiration Date:   08/22/2020  . Hepatic function panel    Standing Status:   Future    Standing Expiration Date:   08/22/2020  . Lipid panel    Standing Status:   Future    Standing Expiration Date:   08/22/2020  . TSH    Standing Status:   Future    Standing Expiration Date:   08/22/2020  . Basic metabolic panel    Standing Status:   Future    Standing Expiration Date:   08/22/2020    Meds ordered this encounter  Medications  . amLODipine (NORVASC) 5 MG tablet    Sig: Take 1 tablet (5 mg total) by mouth daily.    Dispense:  90 tablet    Refill:  1  . sertraline (ZOLOFT) 50 MG tablet    Sig: Take 1 tablet (50 mg total) by mouth daily.    Dispense:  90 tablet    Refill:  1     I discussed the assessment and treatment plan with the patient. The patient was provided an opportunity to ask questions and all were answered. The patient agreed with the plan and demonstrated an understanding of the instructions.   The patient was advised to call back or seek an in-person evaluation if the symptoms worsen or if the condition fails to improve as anticipated.   Einar Pheasant, MD

## 2019-08-25 ENCOUNTER — Encounter: Payer: Self-pay | Admitting: Internal Medicine

## 2019-08-25 NOTE — Assessment & Plan Note (Signed)
Low carb diet and exercise.  Follow met b and a1c.   

## 2019-08-25 NOTE — Assessment & Plan Note (Signed)
Low cholesterol diet and exercise.  Follow lipid panel.   

## 2019-08-25 NOTE — Assessment & Plan Note (Signed)
She feels she is handling things well.  On zoloft.  Stable. Follow.

## 2019-08-25 NOTE — Assessment & Plan Note (Signed)
Blood pressure under good control.  Continue same medication regimen.  Follow pressures.  Follow metabolic panel.   

## 2019-08-25 NOTE — Assessment & Plan Note (Signed)
Colonoscopy 05/2017 - hyperplastic polyp.  Recommended f/u in 5 years.  (Dr Alice Reichert)

## 2019-11-07 ENCOUNTER — Encounter: Payer: Self-pay | Admitting: Internal Medicine

## 2019-11-07 NOTE — Telephone Encounter (Signed)
Pt has been scheduled for 5/27

## 2019-12-26 ENCOUNTER — Other Ambulatory Visit: Payer: Self-pay

## 2019-12-26 ENCOUNTER — Ambulatory Visit (INDEPENDENT_AMBULATORY_CARE_PROVIDER_SITE_OTHER): Payer: No Typology Code available for payment source | Admitting: Internal Medicine

## 2019-12-26 VITALS — BP 124/70 | HR 78 | Temp 97.0°F | Resp 16 | Ht 61.0 in | Wt 192.0 lb

## 2019-12-26 DIAGNOSIS — E7800 Pure hypercholesterolemia, unspecified: Secondary | ICD-10-CM | POA: Diagnosis not present

## 2019-12-26 DIAGNOSIS — I10 Essential (primary) hypertension: Secondary | ICD-10-CM

## 2019-12-26 DIAGNOSIS — Z0000 Encounter for general adult medical examination without abnormal findings: Secondary | ICD-10-CM | POA: Diagnosis not present

## 2019-12-26 DIAGNOSIS — F439 Reaction to severe stress, unspecified: Secondary | ICD-10-CM

## 2019-12-26 DIAGNOSIS — R739 Hyperglycemia, unspecified: Secondary | ICD-10-CM

## 2019-12-26 DIAGNOSIS — W57XXXA Bitten or stung by nonvenomous insect and other nonvenomous arthropods, initial encounter: Secondary | ICD-10-CM

## 2019-12-26 LAB — BASIC METABOLIC PANEL
BUN: 10 mg/dL (ref 6–23)
CO2: 31 mEq/L (ref 19–32)
Calcium: 9.3 mg/dL (ref 8.4–10.5)
Chloride: 103 mEq/L (ref 96–112)
Creatinine, Ser: 0.57 mg/dL (ref 0.40–1.20)
GFR: 107.71 mL/min (ref >60.00)
Glucose, Bld: 107 mg/dL — ABNORMAL HIGH (ref 70–99)
Potassium: 4.5 mEq/L (ref 3.5–5.1)
Sodium: 139 mEq/L (ref 135–145)

## 2019-12-26 LAB — HEPATIC FUNCTION PANEL
ALT: 17 U/L (ref 0–35)
AST: 19 U/L (ref 0–37)
Albumin: 4.5 g/dL (ref 3.5–5.2)
Alkaline Phosphatase: 88 U/L (ref 39–117)
Bilirubin, Direct: 0.1 mg/dL (ref 0.0–0.3)
Total Bilirubin: 0.6 mg/dL (ref 0.2–1.2)
Total Protein: 6.7 g/dL (ref 6.0–8.3)

## 2019-12-26 LAB — CBC WITH DIFFERENTIAL/PLATELET
Basophils Absolute: 0.1 10*3/uL (ref 0.0–0.1)
Basophils Relative: 1.1 % (ref 0.0–3.0)
Eosinophils Absolute: 0.1 10*3/uL (ref 0.0–0.7)
Eosinophils Relative: 1.3 % (ref 0.0–5.0)
HCT: 38.9 % (ref 36.0–46.0)
Hemoglobin: 13.2 g/dL (ref 12.0–15.0)
Lymphocytes Relative: 37.6 % (ref 12.0–46.0)
Lymphs Abs: 1.9 10*3/uL (ref 0.7–4.0)
MCHC: 34 g/dL (ref 30.0–36.0)
MCV: 88.4 fl (ref 78.0–100.0)
Monocytes Absolute: 0.4 10*3/uL (ref 0.1–1.0)
Monocytes Relative: 7.4 % (ref 3.0–12.0)
Neutro Abs: 2.7 10*3/uL (ref 1.4–7.7)
Neutrophils Relative %: 52.6 % (ref 43.0–77.0)
Platelets: 315 10*3/uL (ref 150.0–400.0)
RBC: 4.41 Mil/uL (ref 3.87–5.11)
RDW: 13.2 % (ref 11.5–15.5)
WBC: 5.1 10*3/uL (ref 4.0–10.5)

## 2019-12-26 LAB — LIPID PANEL
Cholesterol: 236 mg/dL — ABNORMAL HIGH (ref 0–200)
HDL: 53.7 mg/dL (ref >39.00)
LDL Cholesterol: 149 mg/dL — ABNORMAL HIGH (ref 0–99)
NonHDL: 182.51
Total CHOL/HDL Ratio: 4
Triglycerides: 169 mg/dL — ABNORMAL HIGH (ref 0.0–149.0)
VLDL: 33.8 mg/dL (ref 0.0–40.0)

## 2019-12-26 LAB — HEMOGLOBIN A1C: Hgb A1c MFr Bld: 5.5 % (ref 4.6–6.5)

## 2019-12-26 LAB — TSH: TSH: 2.61 u[IU]/mL (ref 0.35–4.50)

## 2019-12-26 NOTE — Assessment & Plan Note (Signed)
Physical today 12/26/19.  Mammogram 05/10/19 - Birads I.  Colonoscopy 05/2017 (Dr Alice Reichert).  Hyperplastic polyp.  Recommended f/u in 5 years.  PAP 05/2018 - atrophy changes.  Discussed repeat pap today. She wants to do at her next visit.

## 2019-12-26 NOTE — Progress Notes (Signed)
Patient ID: Ashley Waller, female   DOB: October 14, 1960, 59 y.o.   MRN: [id number]   Subjective:    Patient ID: Ashley Waller, female    DOB: November 24, 1960, 59 y.o.   MRN: [id number]  HPI This visit occurred during the SARS-CoV-2 public health emergency.  Safety protocols were in place, including screening questions prior to the visit, additional usage of staff PPE, and extensive cleaning of exam room while observing appropriate contact time as indicated for disinfecting solutions.  Patient here for her physical exam.  She is doing well.  Feels good. Transitioning back into the office.  Has been working from home.  Tries to stay active.  No chest pain or sob reported.  No significant allergies or sinus issues.  No acid reflux reported.  No abdominal pain.  Bowels moving - stable for her.  Handling stress.  Did remove four ticks from her left leg and one from her right hip.  Only attached for approximately 12 hours.  Has been >5 days since removed.  No fever.  No body aches.  No rash.    Past Medical History:  Diagnosis Date  . Abnormal mammogram 10/15/2012  . Adult BMI 35.0-35.9 kg/sq m 01/31/2017  . Arthritis   . Asthma   . Chest pain 03/13/2016  . Health care maintenance 11/30/2014   Mammogram 12/22/15 - Birads 0.  Recommended f/u right breat mammogram.  Follow up right breast mammogram 01/11/16 - Birads I.  Recommended f/u mammogram in one year.     . Hematuria   . Hemorrhoid   . History of colonic polyps 02/27/2015  . Hypercholesterolemia 07/16/2012  . Hypertension   . Menopausal syndrome 07/22/2012  . Plantar fasciitis of left foot 07/15/2013  . Stress 05/17/2014   Past Surgical History:  Procedure Laterality Date  . COLONOSCOPY  2013  . EYELID LACERATION REPAIR  2006  . TUBAL LIGATION  1995   Family History  Problem Relation Age of Onset  . Hyperlipidemia Mother   . Diabetes Mother   . Hypertension Mother   . Hyperlipidemia Father   . Diabetes Father   . Hypertension Father   . Mental  illness Maternal Grandmother   . Mental illness Maternal Grandfather   . Mental illness Paternal Grandmother   . Mental illness Paternal Grandfather   . Breast cancer Neg Hx    Social History   Socioeconomic History  . Marital status: Married    Spouse name: Not on file  . Number of children: Not on file  . Years of education: Not on file  . Highest education level: Not on file  Occupational History  . Not on file  Tobacco Use  . Smoking status: Never Smoker  . Smokeless tobacco: Never Used  Substance and Sexual Activity  . Alcohol use: Yes    Alcohol/week: 0.0 - 1.0 standard drinks    Comment: ocassionally  . Drug use: No  . Sexual activity: Not on file  Other Topics Concern  . Not on file  Social History Narrative  . Not on file   Social Determinants of Health   Financial Resource Strain:   . Difficulty of Paying Living Expenses:   Food Insecurity:   . Worried About Charity fundraiser in the Last Year:   . Arboriculturist in the Last Year:   Transportation Needs:   . Film/video editor (Medical):   Marland Kitchen Lack of Transportation (Non-Medical):   Physical Activity:   . Days of  Exercise per Week:   . Minutes of Exercise per Session:   Stress:   . Feeling of Stress :   Social Connections:   . Frequency of Communication with Friends and Family:   . Frequency of Social Gatherings with Friends and Family:   . Attends Religious Services:   . Active Member of Clubs or Organizations:   . Attends Archivist Meetings:   Marland Kitchen Marital Status:     Outpatient Encounter Medications as of 12/26/2019  Medication Sig  . acetaminophen (TYLENOL) 325 MG tablet Take 650 mg by mouth every 6 (six) hours as needed for pain.  Marland Kitchen albuterol (PROVENTIL HFA;VENTOLIN HFA) 108 (90 Base) MCG/ACT inhaler Inhale 2 puffs into the lungs every 6 (six) hours as needed for wheezing or shortness of breath.  Marland Kitchen amLODipine (NORVASC) 5 MG tablet Take 1 tablet (5 mg total) by mouth daily.  Marland Kitchen  aspirin 81 MG tablet Take 81 mg by mouth daily.  . Cetirizine HCl (ZYRTEC PO) Take by mouth.  . fluticasone (FLONASE) 50 MCG/ACT nasal spray Place 2 sprays into both nostrils daily. Hold until pt request  . sertraline (ZOLOFT) 50 MG tablet Take 1 tablet (50 mg total) by mouth daily.   No facility-administered encounter medications on file as of 12/26/2019.    Review of Systems  Constitutional: Negative for appetite change and unexpected weight change.  HENT: Negative for congestion and sinus pressure.   Eyes: Negative for pain and visual disturbance.  Respiratory: Negative for cough, chest tightness and shortness of breath.   Cardiovascular: Negative for chest pain, palpitations and leg swelling.  Gastrointestinal: Negative for abdominal pain, diarrhea, nausea and vomiting.  Genitourinary: Negative for difficulty urinating and dysuria.  Musculoskeletal: Negative for joint swelling and myalgias.  Skin: Negative for color change and rash.  Neurological: Negative for dizziness, light-headedness and headaches.  Hematological: Negative for adenopathy. Does not bruise/bleed easily.  Psychiatric/Behavioral: Negative for agitation and dysphoric mood.       Objective:    Physical Exam Constitutional:      General: She is not in acute distress.    Appearance: Normal appearance. She is well-developed.  HENT:     Head: Normocephalic and atraumatic.     Right Ear: External ear normal.     Left Ear: External ear normal.  Eyes:     General: No scleral icterus.       Right eye: No discharge.        Left eye: No discharge.     Conjunctiva/sclera: Conjunctivae normal.  Neck:     Thyroid: No thyromegaly.  Cardiovascular:     Rate and Rhythm: Normal rate and regular rhythm.  Pulmonary:     Effort: No tachypnea, accessory muscle usage or respiratory distress.     Breath sounds: Normal breath sounds. No decreased breath sounds or wheezing.  Chest:     Breasts:        Right: No inverted  nipple, mass, nipple discharge or tenderness (no axillary adenopathy).        Left: No inverted nipple, mass, nipple discharge or tenderness (no axilarry adenopathy).  Abdominal:     General: Bowel sounds are normal.     Palpations: Abdomen is soft.     Tenderness: There is no abdominal tenderness.  Musculoskeletal:        General: No swelling or tenderness.     Cervical back: Neck supple. No tenderness.  Lymphadenopathy:     Cervical: No cervical adenopathy.  Skin:  Findings: No erythema or rash.  Neurological:     Mental Status: She is alert and oriented to person, place, and time.  Psychiatric:        Mood and Affect: Mood normal.        Behavior: Behavior normal.     BP 124/70   Pulse 78   Temp (!) 97 F (36.1 C)   Resp 16   Ht 5' 1"  (1.549 m)   Wt 192 lb (87.1 kg)   LMP 07/16/2009   SpO2 99%   BMI 36.28 kg/m  Wt Readings from Last 3 Encounters:  12/26/19 192 lb (87.1 kg)  08/23/19 189 lb (85.7 kg)  04/05/19 194 lb (88 kg)     Lab Results  Component Value Date   WBC 5.1 12/26/2019   HGB 13.2 12/26/2019   HCT 38.9 12/26/2019   PLT 315.0 12/26/2019   GLUCOSE 107 (H) 12/26/2019   CHOL 236 (H) 12/26/2019   TRIG 169.0 (H) 12/26/2019   HDL 53.70 12/26/2019   LDLDIRECT 145.0 04/03/2019   LDLCALC 149 (H) 12/26/2019   ALT 17 12/26/2019   AST 19 12/26/2019   NA 139 12/26/2019   K 4.5 12/26/2019   CL 103 12/26/2019   CREATININE 0.57 12/26/2019   BUN 10 12/26/2019   CO2 31 12/26/2019   TSH 2.61 12/26/2019   HGBA1C 5.5 12/26/2019    MM 3D SCREEN BREAST BILATERAL  Result Date: 05/10/2019 CLINICAL DATA:  Screening. EXAM: DIGITAL SCREENING BILATERAL MAMMOGRAM WITH TOMO AND CAD COMPARISON:  Previous exam(s). ACR Breast Density Category b: There are scattered areas of fibroglandular density. FINDINGS: There are no findings suspicious for malignancy. Images were processed with CAD. IMPRESSION: No mammographic evidence of malignancy. A result letter of this  screening mammogram will be mailed directly to the patient. RECOMMENDATION: Screening mammogram in one year. (Code:SM-B-01Y) BI-RADS CATEGORY  1: Negative. Electronically Signed   By: Claudie Revering M.D.   On: 05/10/2019 12:35       Assessment & Plan:   Problem List Items Addressed This Visit    Health care maintenance    Physical today 12/26/19.  Mammogram 05/10/19 - Birads I.  Colonoscopy 05/2017 (Dr Alice Reichert).  Hyperplastic polyp.  Recommended f/u in 5 years.  PAP 05/2018 - atrophy changes.  Discussed repeat pap today. She wants to do at her next visit.        Hypercholesterolemia    Low cholesterol diet and exercise.  Follow lipid panel.        Hyperglycemia    Low carb diet and exercise.  Follow met b and a1c.        Hypertension    Blood pressure has been under good control.  Continue current medication regimen - amlodipine.  Follow pressures.  Follow metabolic panel.       Stress    Handling things well.  On zoloft.  Follow.        Tick bite    Previously removed four ticks lower left leg and one tick right hip.  No rash.  No fever.  No aching.  Follow.            Einar Pheasant, MD

## 2019-12-27 ENCOUNTER — Encounter: Payer: Self-pay | Admitting: Internal Medicine

## 2020-01-05 ENCOUNTER — Encounter: Payer: Self-pay | Admitting: Internal Medicine

## 2020-01-05 DIAGNOSIS — W57XXXA Bitten or stung by nonvenomous insect and other nonvenomous arthropods, initial encounter: Secondary | ICD-10-CM | POA: Insufficient documentation

## 2020-01-05 NOTE — Assessment & Plan Note (Signed)
Low cholesterol diet and exercise.  Follow lipid panel.   

## 2020-01-05 NOTE — Assessment & Plan Note (Signed)
Low carb diet and exercise.  Follow met b and a1c.   

## 2020-01-05 NOTE — Assessment & Plan Note (Signed)
Handling things well.  On zoloft.  Follow.

## 2020-01-05 NOTE — Assessment & Plan Note (Signed)
Blood pressure has been under good control.  Continue current medication regimen - amlodipine.  Follow pressures.  Follow metabolic panel.

## 2020-01-05 NOTE — Assessment & Plan Note (Signed)
Previously removed four ticks lower left leg and one tick right hip.  No rash.  No fever.  No aching.  Follow.

## 2020-03-24 ENCOUNTER — Other Ambulatory Visit: Payer: Self-pay | Admitting: Internal Medicine

## 2020-04-28 ENCOUNTER — Other Ambulatory Visit: Payer: Self-pay

## 2020-04-28 ENCOUNTER — Ambulatory Visit (INDEPENDENT_AMBULATORY_CARE_PROVIDER_SITE_OTHER): Payer: No Typology Code available for payment source | Admitting: Internal Medicine

## 2020-04-28 ENCOUNTER — Encounter: Payer: Self-pay | Admitting: Internal Medicine

## 2020-04-28 VITALS — BP 122/70 | HR 89 | Temp 98.5°F | Resp 16 | Ht 61.0 in | Wt 193.6 lb

## 2020-04-28 DIAGNOSIS — I10 Essential (primary) hypertension: Secondary | ICD-10-CM | POA: Diagnosis not present

## 2020-04-28 DIAGNOSIS — F439 Reaction to severe stress, unspecified: Secondary | ICD-10-CM

## 2020-04-28 DIAGNOSIS — Z1231 Encounter for screening mammogram for malignant neoplasm of breast: Secondary | ICD-10-CM | POA: Diagnosis not present

## 2020-04-28 DIAGNOSIS — R739 Hyperglycemia, unspecified: Secondary | ICD-10-CM

## 2020-04-28 DIAGNOSIS — E7800 Pure hypercholesterolemia, unspecified: Secondary | ICD-10-CM

## 2020-04-28 MED ORDER — ALBUTEROL SULFATE HFA 108 (90 BASE) MCG/ACT IN AERS: 2.0000 | INHALATION_SPRAY | Freq: Four times a day (QID) | RESPIRATORY_TRACT | 1 refills | Status: AC | PRN

## 2020-04-28 NOTE — Progress Notes (Signed)
Patient ID: Ashley Waller, female   DOB: 09-10-1960, 59 y.o.   MRN: [id number]   Subjective:    Patient ID: Ashley Waller, female    DOB: [birth year], 59 y.o.   MRN: [id number]  HPI This visit occurred during the SARS-CoV-2 public health emergency.  Safety protocols were in place, including screening questions prior to the visit, additional usage of staff PPE, and extensive cleaning of exam room while observing appropriate contact time as indicated for disinfecting solutions.  Patient here for a scheduled follow up.  States she is doing relatively well.  Increased stress with covid, etc, but overall she feels she is handling things relatively well.  Discussed diet and exercise.  She plans to start exercising more.  Wants to return to belly dancing.  No chest pain or sob reported.  No abdominal pain or bowel change reported.  Some intermittent loose stool, but no problems currently.  Previously had swelling - right foot and lower extremity.  Noticed after starting amlodipine.  No swelling now.  Discussed staying hydrated.    Past Medical History:  Diagnosis Date  . Abnormal mammogram 10/15/2012  . Adult BMI 35.0-35.9 kg/sq m 01/31/2017  . Arthritis   . Asthma   . Chest pain 03/13/2016  . Health care maintenance 11/30/2014   Mammogram 12/22/15 - Birads 0.  Recommended f/u right breat mammogram.  Follow up right breast mammogram 01/11/16 - Birads I.  Recommended f/u mammogram in one year.     . Hematuria   . Hemorrhoid   . History of colonic polyps 02/27/2015  . Hypercholesterolemia 07/16/2012  . Hypertension   . Menopausal syndrome 07/22/2012  . Plantar fasciitis of left foot 07/15/2013  . Stress 05/17/2014   Past Surgical History:  Procedure Laterality Date  . COLONOSCOPY  2013  . EYELID LACERATION REPAIR  2006  . TUBAL LIGATION  1995   Family History  Problem Relation Age of Onset  . Hyperlipidemia Mother   . Diabetes Mother   . Hypertension Mother   . Hyperlipidemia Father   . Diabetes  Father   . Hypertension Father   . Mental illness Maternal Grandmother   . Mental illness Maternal Grandfather   . Mental illness Paternal Grandmother   . Mental illness Paternal Grandfather   . Breast cancer Neg Hx    Social History   Socioeconomic History  . Marital status: Married    Spouse name: Not on file  . Number of children: Not on file  . Years of education: Not on file  . Highest education level: Not on file  Occupational History  . Not on file  Tobacco Use  . Smoking status: Never Smoker  . Smokeless tobacco: Never Used  Substance and Sexual Activity  . Alcohol use: Yes    Alcohol/week: 0.0 - 1.0 standard drinks    Comment: ocassionally  . Drug use: No  . Sexual activity: Not on file  Other Topics Concern  . Not on file  Social History Narrative  . Not on file   Social Determinants of Health   Financial Resource Strain:   . Difficulty of Paying Living Expenses: Not on file  Food Insecurity:   . Worried About Charity fundraiser in the Last Year: Not on file  . Ran Out of Food in the Last Year: Not on file  Transportation Needs:   . Lack of Transportation (Medical): Not on file  . Lack of Transportation (Non-Medical): Not on file  Physical Activity:   .  Days of Exercise per Week: Not on file  . Minutes of Exercise per Session: Not on file  Stress:   . Feeling of Stress : Not on file  Social Connections:   . Frequency of Communication with Friends and Family: Not on file  . Frequency of Social Gatherings with Friends and Family: Not on file  . Attends Religious Services: Not on file  . Active Member of Clubs or Organizations: Not on file  . Attends Archivist Meetings: Not on file  . Marital Status: Not on file    Outpatient Encounter Medications as of 04/28/2020  Medication Sig  . acetaminophen (TYLENOL) 325 MG tablet Take 650 mg by mouth every 6 (six) hours as needed for pain.  Marland Kitchen albuterol (VENTOLIN HFA) 108 (90 Base) MCG/ACT inhaler  Inhale 2 puffs into the lungs every 6 (six) hours as needed for wheezing or shortness of breath.  Marland Kitchen amLODipine (NORVASC) 5 MG tablet TAKE 1 TABLET BY MOUTH DAILY.  Marland Kitchen aspirin 81 MG tablet Take 81 mg by mouth daily.  . Cetirizine HCl (ZYRTEC PO) Take by mouth.  . fluticasone (FLONASE) 50 MCG/ACT nasal spray Place 2 sprays into both nostrils daily. Hold until pt request  . sertraline (ZOLOFT) 50 MG tablet TAKE 1 TABLET BY MOUTH DAILY.  . [DISCONTINUED] albuterol (PROVENTIL HFA;VENTOLIN HFA) 108 (90 Base) MCG/ACT inhaler Inhale 2 puffs into the lungs every 6 (six) hours as needed for wheezing or shortness of breath.   No facility-administered encounter medications on file as of 04/28/2020.    Review of Systems  Constitutional: Negative for appetite change and unexpected weight change.  HENT: Negative for congestion and sinus pressure.   Respiratory: Negative for cough, chest tightness and shortness of breath.   Cardiovascular: Negative for chest pain and palpitations.       Previous lower extremity swelling.  Resolved.    Gastrointestinal: Negative for abdominal pain, diarrhea, nausea and vomiting.  Genitourinary: Negative for difficulty urinating and dysuria.  Musculoskeletal: Negative for joint swelling and myalgias.  Skin: Negative for color change and rash.  Neurological: Negative for dizziness, light-headedness and headaches.  Psychiatric/Behavioral: Negative for agitation and dysphoric mood.       Objective:    Physical Exam Vitals reviewed.  Constitutional:      General: She is not in acute distress.    Appearance: Normal appearance.  HENT:     Head: Normocephalic and atraumatic.     Right Ear: External ear normal.     Left Ear: External ear normal.  Eyes:     General: No scleral icterus.       Right eye: No discharge.        Left eye: No discharge.     Conjunctiva/sclera: Conjunctivae normal.  Neck:     Thyroid: No thyromegaly.  Cardiovascular:     Rate and Rhythm:  Normal rate and regular rhythm.     Pulses: Normal pulses.  Pulmonary:     Effort: No respiratory distress.     Breath sounds: Normal breath sounds. No wheezing.  Abdominal:     General: Bowel sounds are normal.     Palpations: Abdomen is soft.     Tenderness: There is no abdominal tenderness.  Musculoskeletal:        General: No swelling or tenderness.     Cervical back: Neck supple. No tenderness.  Lymphadenopathy:     Cervical: No cervical adenopathy.  Skin:    Findings: No erythema or rash.  Neurological:  Mental Status: She is alert.  Psychiatric:        Mood and Affect: Mood normal.        Behavior: Behavior normal.     BP 122/70   Pulse 89   Temp 98.5 F (36.9 C) (Oral)   Resp 16   Ht 5' 1"  (1.549 m)   Wt 193 lb 9.6 oz (87.8 kg)   LMP 07/16/2009   SpO2 98%   BMI 36.58 kg/m  Wt Readings from Last 3 Encounters:  04/28/20 193 lb 9.6 oz (87.8 kg)  12/26/19 192 lb (87.1 kg)  08/23/19 189 lb (85.7 kg)     Lab Results  Component Value Date   WBC 5.1 12/26/2019   HGB 13.2 12/26/2019   HCT 38.9 12/26/2019   PLT 315.0 12/26/2019   GLUCOSE 107 (H) 12/26/2019   CHOL 236 (H) 12/26/2019   TRIG 169.0 (H) 12/26/2019   HDL 53.70 12/26/2019   LDLDIRECT 145.0 04/03/2019   LDLCALC 149 (H) 12/26/2019   ALT 17 12/26/2019   AST 19 12/26/2019   NA 139 12/26/2019   K 4.5 12/26/2019   CL 103 12/26/2019   CREATININE 0.57 12/26/2019   BUN 10 12/26/2019   CO2 31 12/26/2019   TSH 2.61 12/26/2019   HGBA1C 5.5 12/26/2019    MM 3D SCREEN BREAST BILATERAL  Result Date: 05/10/2019 CLINICAL DATA:  Screening. EXAM: DIGITAL SCREENING BILATERAL MAMMOGRAM WITH TOMO AND CAD COMPARISON:  Previous exam(s). ACR Breast Density Category b: There are scattered areas of fibroglandular density. FINDINGS: There are no findings suspicious for malignancy. Images were processed with CAD. IMPRESSION: No mammographic evidence of malignancy. A result letter of this screening mammogram will be  mailed directly to the patient. RECOMMENDATION: Screening mammogram in one year. (Code:SM-B-01Y) BI-RADS CATEGORY  1: Negative. Electronically Signed   By: Claudie Revering M.D.   On: 05/10/2019 12:35       Assessment & Plan:   Problem List Items Addressed This Visit    Stress    Overall handling things relatively well.  Has good support.  Follow. Continue zoloft.       Hypertension    Blood pressure doing well on amlodipine.  Follow pressures.  Follow metabolic panel.       Relevant Orders   Basic metabolic panel   Hyperglycemia    Low carb diet and exercise.  Follow met b and a1c.       Relevant Orders   Hemoglobin A1c   Hypercholesterolemia    Low cholesterol diet and exercise.  Follow lipid panel and liver function tests.        Relevant Orders   Hepatic function panel   Lipid panel    Other Visit Diagnoses    Visit for screening mammogram    -  Primary   Relevant Orders   MM 3D SCREEN BREAST BILATERAL       Einar Pheasant, MD

## 2020-05-10 ENCOUNTER — Encounter: Payer: Self-pay | Admitting: Internal Medicine

## 2020-05-10 NOTE — Assessment & Plan Note (Signed)
Low cholesterol diet and exercise.  Follow lipid panel and liver function tests.  

## 2020-05-10 NOTE — Assessment & Plan Note (Signed)
Low carb diet and exercise.  Follow met b and a1c.  

## 2020-05-10 NOTE — Assessment & Plan Note (Signed)
Blood pressure doing well on amlodipine.  Follow pressures.  Follow metabolic panel.   

## 2020-05-10 NOTE — Assessment & Plan Note (Signed)
Overall handling things relatively well.  Has good support.  Follow. Continue zoloft.

## 2020-05-19 ENCOUNTER — Other Ambulatory Visit: Payer: Self-pay

## 2020-05-19 ENCOUNTER — Ambulatory Visit
Admission: RE | Admit: 2020-05-19 | Discharge: 2020-05-19 | Disposition: A | Discharge status: Home / Self Care | Payer: No Typology Code available for payment source | Source: Ambulatory Visit | Attending: Internal Medicine | Admitting: Internal Medicine

## 2020-05-19 DIAGNOSIS — Z1231 Encounter for screening mammogram for malignant neoplasm of breast: Secondary | ICD-10-CM | POA: Diagnosis present

## 2020-08-26 ENCOUNTER — Other Ambulatory Visit (INDEPENDENT_AMBULATORY_CARE_PROVIDER_SITE_OTHER): Payer: No Typology Code available for payment source

## 2020-08-26 ENCOUNTER — Other Ambulatory Visit: Payer: Self-pay

## 2020-08-26 DIAGNOSIS — R739 Hyperglycemia, unspecified: Secondary | ICD-10-CM

## 2020-08-26 DIAGNOSIS — E7800 Pure hypercholesterolemia, unspecified: Secondary | ICD-10-CM

## 2020-08-26 DIAGNOSIS — I10 Essential (primary) hypertension: Secondary | ICD-10-CM

## 2020-08-26 LAB — LIPID PANEL
Cholesterol: 226 mg/dL — ABNORMAL HIGH (ref 0–200)
HDL: 50 mg/dL (ref >39.00)
NonHDL: 175.97
Total CHOL/HDL Ratio: 5
Triglycerides: 219 mg/dL — ABNORMAL HIGH (ref 0.0–149.0)
VLDL: 43.8 mg/dL — ABNORMAL HIGH (ref 0.0–40.0)

## 2020-08-26 LAB — LDL CHOLESTEROL, DIRECT: Direct LDL: 150 mg/dL

## 2020-08-26 LAB — BASIC METABOLIC PANEL
BUN: 13 mg/dL (ref 6–23)
CO2: 29 mEq/L (ref 19–32)
Calcium: 9.7 mg/dL (ref 8.4–10.5)
Chloride: 101 mEq/L (ref 96–112)
Creatinine, Ser: 0.62 mg/dL (ref 0.40–1.20)
GFR: 95.73 mL/min (ref >60.00)
Glucose, Bld: 115 mg/dL — ABNORMAL HIGH (ref 70–99)
Potassium: 4.3 mEq/L (ref 3.5–5.1)
Sodium: 136 mEq/L (ref 135–145)

## 2020-08-26 LAB — HEPATIC FUNCTION PANEL
ALT: 11 U/L (ref 0–35)
AST: 13 U/L (ref 0–37)
Albumin: 4.6 g/dL (ref 3.5–5.2)
Alkaline Phosphatase: 81 U/L (ref 39–117)
Bilirubin, Direct: 0.1 mg/dL (ref 0.0–0.3)
Total Bilirubin: 0.6 mg/dL (ref 0.2–1.2)
Total Protein: 7.1 g/dL (ref 6.0–8.3)

## 2020-08-26 LAB — HEMOGLOBIN A1C: Hgb A1c MFr Bld: 5.5 % (ref 4.6–6.5)

## 2020-08-28 ENCOUNTER — Other Ambulatory Visit: Payer: Self-pay

## 2020-08-28 ENCOUNTER — Ambulatory Visit (INDEPENDENT_AMBULATORY_CARE_PROVIDER_SITE_OTHER): Payer: No Typology Code available for payment source | Admitting: Internal Medicine

## 2020-08-28 ENCOUNTER — Other Ambulatory Visit: Payer: Self-pay | Admitting: Internal Medicine

## 2020-08-28 ENCOUNTER — Encounter: Payer: Self-pay | Admitting: Internal Medicine

## 2020-08-28 VITALS — BP 124/90 | HR 83 | Temp 98.5°F | Ht 60.98 in | Wt 192.6 lb

## 2020-08-28 DIAGNOSIS — R739 Hyperglycemia, unspecified: Secondary | ICD-10-CM

## 2020-08-28 DIAGNOSIS — F439 Reaction to severe stress, unspecified: Secondary | ICD-10-CM

## 2020-08-28 DIAGNOSIS — E7800 Pure hypercholesterolemia, unspecified: Secondary | ICD-10-CM

## 2020-08-28 DIAGNOSIS — I10 Essential (primary) hypertension: Secondary | ICD-10-CM

## 2020-08-28 DIAGNOSIS — N951 Menopausal and female climacteric states: Secondary | ICD-10-CM

## 2020-08-28 MED ORDER — HYDROCHLOROTHIAZIDE 12.5 MG PO CAPS: 12.5000 mg | ORAL_CAPSULE | Freq: Every day | ORAL | 1 refills | Status: DC

## 2020-08-28 NOTE — Assessment & Plan Note (Addendum)
The 10-year ASCVD risk score Mikey Bussing DC Brooke Bonito., et al., 2013) is: 7.4%   Values used to calculate the score:     Age: 60 years     Sex: Female     Is Non-Hispanic African American: No     Diabetic: No     Tobacco smoker: No     Systolic Blood Pressure: 001 mmHg     Is BP treated: Yes     HDL Cholesterol: 50 mg/dL     Total Cholesterol: 226 mg/dL   Discussed calculated cholesterol risk.  She wants to hold on starting a cholesterol medication at this time.  Low cholesterol diet and exercise.  Follow lipid panel.

## 2020-08-28 NOTE — Progress Notes (Signed)
Patient ID: Ashley Waller, female   DOB: 1960/12/19, 60 y.o.   MRN: [id number]   Subjective:    Patient ID: Ashley Waller, female    DOB: May 30, 1961, 60 y.o.   MRN: [id number]  HPI This visit occurred during the SARS-CoV-2 public health emergency.  Safety protocols were in place, including screening questions prior to the visit, additional usage of staff PPE, and extensive cleaning of exam room while observing appropriate contact time as indicated for disinfecting solutions.  Patient here for a scheduled follow up.  Here to follow up regarding her blood pressure, cholesterol and blood sugar.  She reports she is doing relatively well.  Has noticed occasional hot flash, hair thinning.  No mood change.  Handling stressl.  Tries to stay active.  No chest pain or sob reported.  No acid reflux or abdominal pain reported.  Bowels moving.  Blood pressure has been a little elevated.  Taking amlodipine.    Past Medical History:  Diagnosis Date  . Abnormal mammogram 10/15/2012  . Adult BMI 35.0-35.9 kg/sq m 01/31/2017  . Arthritis   . Asthma   . Chest pain 03/13/2016  . Health care maintenance 11/30/2014   Mammogram 12/22/15 - Birads 0.  Recommended f/u right breat mammogram.  Follow up right breast mammogram 01/11/16 - Birads I.  Recommended f/u mammogram in one year.     . Hematuria   . Hemorrhoid   . History of colonic polyps 02/27/2015  . Hypercholesterolemia 07/16/2012  . Hypertension   . Menopausal syndrome 07/22/2012  . Plantar fasciitis of left foot 07/15/2013  . Stress 05/17/2014   Past Surgical History:  Procedure Laterality Date  . COLONOSCOPY  2013  . EYELID LACERATION REPAIR  2006  . TUBAL LIGATION  1995   Family History  Problem Relation Age of Onset  . Hyperlipidemia Mother   . Diabetes Mother   . Hypertension Mother   . Hyperlipidemia Father   . Diabetes Father   . Hypertension Father   . Mental illness Maternal Grandmother   . Mental illness Maternal Grandfather   . Mental  illness Paternal Grandmother   . Mental illness Paternal Grandfather   . Breast cancer Neg Hx    Social History   Socioeconomic History  . Marital status: Married    Spouse name: Not on file  . Number of children: Not on file  . Years of education: Not on file  . Highest education level: Not on file  Occupational History  . Not on file  Tobacco Use  . Smoking status: Never Smoker  . Smokeless tobacco: Never Used  Substance and Sexual Activity  . Alcohol use: Yes    Alcohol/week: 0.0 - 1.0 standard drinks    Comment: ocassionally  . Drug use: No  . Sexual activity: Not on file  Other Topics Concern  . Not on file  Social History Narrative  . Not on file   Social Determinants of Health   Financial Resource Strain: Not on file  Food Insecurity: Not on file  Transportation Needs: Not on file  Physical Activity: Not on file  Stress: Not on file  Social Connections: Not on file    Outpatient Encounter Medications as of 08/28/2020  Medication Sig  . acetaminophen (TYLENOL) 325 MG tablet Take 650 mg by mouth every 6 (six) hours as needed for pain.  Marland Kitchen albuterol (VENTOLIN HFA) 108 (90 Base) MCG/ACT inhaler Inhale 2 puffs into the lungs every 6 (six) hours as needed for wheezing  or shortness of breath.  Marland Kitchen amLODipine (NORVASC) 5 MG tablet TAKE 1 TABLET BY MOUTH DAILY.  Marland Kitchen aspirin 81 MG tablet Take 81 mg by mouth daily.  . Cetirizine HCl (ZYRTEC PO) Take by mouth.  . fluticasone (FLONASE) 50 MCG/ACT nasal spray Place 2 sprays into both nostrils daily. Hold until pt request  . hydrochlorothiazide (MICROZIDE) 12.5 MG capsule Take 1 capsule (12.5 mg total) by mouth daily.  . sertraline (ZOLOFT) 50 MG tablet TAKE 1 TABLET BY MOUTH DAILY.   No facility-administered encounter medications on file as of 08/28/2020.    Review of Systems  Constitutional: Negative for appetite change and unexpected weight change.  HENT: Negative for congestion and sinus pressure.   Respiratory: Negative  for cough, chest tightness and shortness of breath.   Cardiovascular: Negative for chest pain, palpitations and leg swelling.  Gastrointestinal: Negative for abdominal pain, diarrhea, nausea and vomiting.  Genitourinary: Negative for difficulty urinating and dysuria.  Musculoskeletal: Negative for joint swelling and myalgias.  Skin: Negative for color change and rash.  Neurological: Negative for dizziness, light-headedness and headaches.  Psychiatric/Behavioral: Negative for agitation and dysphoric mood.       Objective:    Physical Exam Vitals reviewed.  Constitutional:      General: She is not in acute distress.    Appearance: Normal appearance.  HENT:     Head: Normocephalic and atraumatic.     Right Ear: External ear normal.     Left Ear: External ear normal.     Mouth/Throat:     Mouth: Oropharynx is clear and moist.  Eyes:     General: No scleral icterus.       Right eye: No discharge.        Left eye: No discharge.     Conjunctiva/sclera: Conjunctivae normal.  Neck:     Thyroid: No thyromegaly.  Cardiovascular:     Rate and Rhythm: Normal rate and regular rhythm.  Pulmonary:     Effort: No respiratory distress.     Breath sounds: Normal breath sounds. No wheezing.  Abdominal:     General: Bowel sounds are normal.     Palpations: Abdomen is soft.     Tenderness: There is no abdominal tenderness.  Musculoskeletal:        General: No swelling, tenderness or edema.     Cervical back: Neck supple. No tenderness.  Lymphadenopathy:     Cervical: No cervical adenopathy.  Skin:    Findings: No erythema or rash.  Neurological:     Mental Status: She is alert.  Psychiatric:        Mood and Affect: Mood normal.        Behavior: Behavior normal.     BP 124/90   Pulse 83   Temp 98.5 F (36.9 C)   Ht 5' 0.98" (1.549 m)   Wt 192 lb 9.6 oz (87.4 kg)   LMP 07/16/2009   SpO2 96%   BMI 36.41 kg/m  Wt Readings from Last 3 Encounters:  08/28/20 192 lb 9.6 oz (87.4  kg)  04/28/20 193 lb 9.6 oz (87.8 kg)  12/26/19 192 lb (87.1 kg)     Lab Results  Component Value Date   WBC 5.1 12/26/2019   HGB 13.2 12/26/2019   HCT 38.9 12/26/2019   PLT 315.0 12/26/2019   GLUCOSE 115 (H) 08/26/2020   CHOL 226 (H) 08/26/2020   TRIG 219.0 (H) 08/26/2020   HDL 50.00 08/26/2020   LDLDIRECT 150.0 08/26/2020   [place]  149 (H) 12/26/2019   ALT 11 08/26/2020   AST 13 08/26/2020   NA 136 08/26/2020   K 4.3 08/26/2020   CL 101 08/26/2020   CREATININE 0.62 08/26/2020   BUN 13 08/26/2020   CO2 29 08/26/2020   TSH 2.61 12/26/2019   HGBA1C 5.5 08/26/2020    MM 3D SCREEN BREAST BILATERAL  Result Date: 05/20/2020 CLINICAL DATA:  Screening. EXAM: DIGITAL SCREENING BILATERAL MAMMOGRAM WITH TOMO AND CAD COMPARISON:  Previous exam(s). ACR Breast Density Category b: There are scattered areas of fibroglandular density. FINDINGS: There are no findings suspicious for malignancy. Images were processed with CAD. IMPRESSION: No mammographic evidence of malignancy. A result letter of this screening mammogram will be mailed directly to the patient. RECOMMENDATION: Screening mammogram in one year. (Code:SM-B-01Y) BI-RADS CATEGORY  1: Negative. Electronically Signed   By: Ammie Ferrier M.D.   On: 05/20/2020 14:27       Assessment & Plan:   Problem List Items Addressed This Visit    Hypercholesterolemia    The 10-year ASCVD risk score Mikey Bussing DC Jr., et al., 2013) is: 7.4%   Values used to calculate the score:     Age: 53 years     Sex: Female     Is Non-Hispanic African American: No     Diabetic: No     Tobacco smoker: No     Systolic Blood Pressure: 016 mmHg     Is BP treated: Yes     HDL Cholesterol: 50 mg/dL     Total Cholesterol: 226 mg/dL   Discussed calculated cholesterol risk.  She wants to hold on starting a cholesterol medication at this time.  Low cholesterol diet and exercise.  Follow lipid panel.       Relevant Medications   hydrochlorothiazide  (MICROZIDE) 12.5 MG capsule   Other Relevant Orders   TSH   Hyperglycemia    Low carb diet and exercise.  Follow met b and a1c.       Hypertension - Primary    Blood pressure elevated.  On amlodipine.  Add hctz 12.46m q day.  Follow pressures.  Send in readings over the next several weeks.  Check metb in 4 weeks.        Relevant Medications   hydrochlorothiazide (MICROZIDE) 12.5 MG capsule   Other Relevant Orders   TSH   Basic metabolic panel   Menopausal syndrome    Symptoms described may be related.  Will check tsh with next labs to confirm wnl.       Stress    Overall handling things relatively well.  Follow.           CEinar Pheasant MD

## 2020-08-29 ENCOUNTER — Encounter: Payer: Self-pay | Admitting: Internal Medicine

## 2020-08-29 NOTE — Assessment & Plan Note (Signed)
Blood pressure elevated.  On amlodipine.  Add hctz 12.5mg  q day.  Follow pressures.  Send in readings over the next several weeks.  Check metb in 4 weeks.

## 2020-08-29 NOTE — Assessment & Plan Note (Signed)
Low carb diet and exercise.  Follow met b and a1c.  

## 2020-08-29 NOTE — Assessment & Plan Note (Signed)
Overall handling things relatively well.  Follow.  

## 2020-08-29 NOTE — Assessment & Plan Note (Signed)
Symptoms described may be related.  Will check tsh with next labs to confirm wnl.

## 2020-09-21 ENCOUNTER — Encounter: Payer: Self-pay | Admitting: Internal Medicine

## 2020-09-25 ENCOUNTER — Other Ambulatory Visit (INDEPENDENT_AMBULATORY_CARE_PROVIDER_SITE_OTHER): Payer: No Typology Code available for payment source

## 2020-09-25 ENCOUNTER — Other Ambulatory Visit: Payer: Self-pay

## 2020-09-25 DIAGNOSIS — I10 Essential (primary) hypertension: Secondary | ICD-10-CM

## 2020-09-25 DIAGNOSIS — E7800 Pure hypercholesterolemia, unspecified: Secondary | ICD-10-CM

## 2020-09-25 LAB — BASIC METABOLIC PANEL
BUN: 12 mg/dL (ref 6–23)
CO2: 32 mEq/L (ref 19–32)
Calcium: 9.4 mg/dL (ref 8.4–10.5)
Chloride: 101 mEq/L (ref 96–112)
Creatinine, Ser: 0.68 mg/dL (ref 0.40–1.20)
GFR: 93.57 mL/min (ref >60.00)
Glucose, Bld: 151 mg/dL — ABNORMAL HIGH (ref 70–99)
Potassium: 4.1 mEq/L (ref 3.5–5.1)
Sodium: 141 mEq/L (ref 135–145)

## 2020-09-25 LAB — TSH: TSH: 1.8 u[IU]/mL (ref 0.35–4.50)

## 2020-10-27 ENCOUNTER — Other Ambulatory Visit: Payer: Self-pay | Admitting: Internal Medicine

## 2020-12-14 ENCOUNTER — Other Ambulatory Visit: Payer: Self-pay

## 2020-12-14 MED FILL — Hydrochlorothiazide Cap 12.5 MG: ORAL | 90 days supply | Qty: 90 | Fill #0 | Status: AC

## 2020-12-29 ENCOUNTER — Ambulatory Visit (INDEPENDENT_AMBULATORY_CARE_PROVIDER_SITE_OTHER): Payer: No Typology Code available for payment source | Admitting: Internal Medicine

## 2020-12-29 ENCOUNTER — Other Ambulatory Visit (HOSPITAL_COMMUNITY)
Admission: RE | Admit: 2020-12-29 | Discharge: 2020-12-29 | Disposition: A | Discharge status: Home / Self Care | Payer: No Typology Code available for payment source | Source: Ambulatory Visit | Attending: Internal Medicine | Admitting: Internal Medicine

## 2020-12-29 ENCOUNTER — Other Ambulatory Visit: Payer: Self-pay

## 2020-12-29 VITALS — BP 116/70 | HR 81 | Temp 97.8°F | Resp 16 | Ht 61.0 in | Wt 191.6 lb

## 2020-12-29 DIAGNOSIS — I10 Essential (primary) hypertension: Secondary | ICD-10-CM

## 2020-12-29 DIAGNOSIS — Z124 Encounter for screening for malignant neoplasm of cervix: Secondary | ICD-10-CM

## 2020-12-29 DIAGNOSIS — Z0000 Encounter for general adult medical examination without abnormal findings: Secondary | ICD-10-CM

## 2020-12-29 DIAGNOSIS — F439 Reaction to severe stress, unspecified: Secondary | ICD-10-CM

## 2020-12-29 DIAGNOSIS — R739 Hyperglycemia, unspecified: Secondary | ICD-10-CM | POA: Diagnosis not present

## 2020-12-29 DIAGNOSIS — E7800 Pure hypercholesterolemia, unspecified: Secondary | ICD-10-CM

## 2020-12-29 NOTE — Progress Notes (Signed)
Patient ID: SALVATRICE MORANDI, female   DOB: [birth year], 60 y.o.   MRN: [id number]   Subjective:    Patient ID: Ashley Waller, female    DOB: 1960-12-04, 60 y.o.   MRN: [id number]  HPI This visit occurred during the SARS-CoV-2 public health emergency.  Safety protocols were in place, including screening questions prior to the visit, additional usage of staff PPE, and extensive cleaning of exam room while observing appropriate contact time as indicated for disinfecting solutions.  Patient here for her physical exam.  She reports she is doing relatively well.  Her brother is visiting from [place].  Reports no chest pain or sob.  No increased cough or congestion.  No acid reflux.  No abdominal pain or bowel change reported.  Planning to change positions at work.  Starting a new project.  Handling stress.  Overall feels good.    Past Medical History:  Diagnosis Date  . Abnormal mammogram 10/15/2012  . Adult BMI 35.0-35.9 kg/sq m 01/31/2017  . Arthritis   . Asthma   . Chest pain 03/13/2016  . Health care maintenance 11/30/2014   Mammogram 12/22/15 - Birads 0.  Recommended f/u right breat mammogram.  Follow up right breast mammogram 01/11/16 - Birads I.  Recommended f/u mammogram in one year.     . Hematuria   . Hemorrhoid   . History of colonic polyps 02/27/2015  . Hypercholesterolemia 07/16/2012  . Hypertension   . Menopausal syndrome 07/22/2012  . Plantar fasciitis of left foot 07/15/2013  . Stress 05/17/2014   Past Surgical History:  Procedure Laterality Date  . COLONOSCOPY  2013  . EYELID LACERATION REPAIR  2006  . TUBAL LIGATION  1995   Family History  Problem Relation Age of Onset  . Hyperlipidemia Mother   . Diabetes Mother   . Hypertension Mother   . Hyperlipidemia Father   . Diabetes Father   . Hypertension Father   . Mental illness Maternal Grandmother   . Mental illness Maternal Grandfather   . Mental illness Paternal Grandmother   . Mental illness Paternal Grandfather   . Breast  cancer Neg Hx    Social History   Socioeconomic History  . Marital status: Married    Spouse name: Not on file  . Number of children: Not on file  . Years of education: Not on file  . Highest education level: Not on file  Occupational History  . Not on file  Tobacco Use  . Smoking status: Never Smoker  . Smokeless tobacco: Never Used  Substance and Sexual Activity  . Alcohol use: Yes    Alcohol/week: 0.0 - 1.0 standard drinks    Comment: ocassionally  . Drug use: No  . Sexual activity: Not on file  Other Topics Concern  . Not on file  Social History Narrative  . Not on file   Social Determinants of Health   Financial Resource Strain: Not on file  Food Insecurity: Not on file  Transportation Needs: Not on file  Physical Activity: Not on file  Stress: Not on file  Social Connections: Not on file    Outpatient Encounter Medications as of 12/29/2020  Medication Sig  . acetaminophen (TYLENOL) 325 MG tablet Take 650 mg by mouth every 6 (six) hours as needed for pain.  Marland Kitchen albuterol (VENTOLIN HFA) 108 (90 Base) MCG/ACT inhaler Inhale 2 puffs into the lungs every 6 (six) hours as needed for wheezing or shortness of breath.  Marland Kitchen amLODipine (NORVASC) 5 MG tablet TAKE  1 TABLET BY MOUTH DAILY.  Marland Kitchen Cetirizine HCl (ZYRTEC PO) Take by mouth.  . fluticasone (FLONASE) 50 MCG/ACT nasal spray Place 2 sprays into both nostrils daily. Hold until pt request  . hydrochlorothiazide (MICROZIDE) 12.5 MG capsule TAKE 1 CAPSULE BY MOUTH DAILY.  Marland Kitchen sertraline (ZOLOFT) 50 MG tablet TAKE 1 TABLET BY MOUTH DAILY.  . [DISCONTINUED] aspirin 81 MG tablet Take 81 mg by mouth daily.   No facility-administered encounter medications on file as of 12/29/2020.    Review of Systems  Constitutional: Negative for appetite change and unexpected weight change.  HENT: Negative for congestion, sinus pressure and sore throat.   Eyes: Negative for pain and visual disturbance.  Respiratory: Negative for cough, chest  tightness and shortness of breath.   Cardiovascular: Negative for chest pain, palpitations and leg swelling.  Gastrointestinal: Negative for abdominal pain, diarrhea, nausea and vomiting.  Genitourinary: Negative for difficulty urinating and dysuria.  Musculoskeletal: Negative for joint swelling and myalgias.  Skin: Negative for color change and rash.  Neurological: Negative for dizziness, light-headedness and headaches.  Hematological: Negative for adenopathy. Does not bruise/bleed easily.  Psychiatric/Behavioral: Negative for decreased concentration and dysphoric mood.       Objective:    Physical Exam Vitals reviewed.  Constitutional:      General: She is not in acute distress.    Appearance: Normal appearance. She is well-developed.  HENT:     Head: Normocephalic and atraumatic.     Right Ear: External ear normal.     Left Ear: External ear normal.  Eyes:     General: No scleral icterus.       Right eye: No discharge.        Left eye: No discharge.     Conjunctiva/sclera: Conjunctivae normal.  Neck:     Thyroid: No thyromegaly.  Cardiovascular:     Rate and Rhythm: Normal rate and regular rhythm.  Pulmonary:     Effort: No tachypnea, accessory muscle usage or respiratory distress.     Breath sounds: Normal breath sounds. No decreased breath sounds or wheezing.  Chest:  Breasts:     Right: No inverted nipple, mass, nipple discharge or tenderness (no axillary adenopathy).     Left: No inverted nipple, mass, nipple discharge or tenderness (no axilarry adenopathy).    Abdominal:     General: Bowel sounds are normal.     Palpations: Abdomen is soft.     Tenderness: There is no abdominal tenderness.  Genitourinary:    Comments: Normal external genitalia.  Vaginal vault without lesions.  Cervix identified.  Pap smear performed.  Could not appreciate any adnexal masses or tenderness.   Musculoskeletal:        General: No swelling or tenderness.     Cervical back: Neck  supple. No tenderness.  Lymphadenopathy:     Cervical: No cervical adenopathy.  Skin:    Findings: No erythema or rash.  Neurological:     Mental Status: She is alert and oriented to person, place, and time.  Psychiatric:        Mood and Affect: Mood normal.        Behavior: Behavior normal.     BP 116/70   Pulse 81   Temp 97.8 F (36.6 C)   Resp 16   Ht _0  (1.549 m)   Wt 191 lb 9.6 oz (86.9 kg)   LMP 07/16/2009   SpO2 99%   BMI 36.20 kg/m  Wt Readings from Last 3 Encounters:  12/29/20 191 lb 9.6 oz (86.9 kg)  08/28/20 192 lb 9.6 oz (87.4 kg)  04/28/20 193 lb 9.6 oz (87.8 kg)     Lab Results  Component Value Date   WBC 5.1 12/26/2019   HGB 13.2 12/26/2019   HCT 38.9 12/26/2019   PLT 315.0 12/26/2019   GLUCOSE 151 (H) 09/25/2020   CHOL 226 (H) 08/26/2020   TRIG 219.0 (H) 08/26/2020   HDL 50.00 08/26/2020   LDLDIRECT 150.0 08/26/2020   LDLCALC 149 (H) 12/26/2019   ALT 11 08/26/2020   AST 13 08/26/2020   NA 141 09/25/2020   K 4.1 09/25/2020   CL 101 09/25/2020   CREATININE 0.68 09/25/2020   BUN 12 09/25/2020   CO2 32 09/25/2020   TSH 1.80 09/25/2020   HGBA1C 5.5 08/26/2020    MM 3D SCREEN BREAST BILATERAL  Result Date: 05/20/2020 CLINICAL DATA:  Screening. EXAM: DIGITAL SCREENING BILATERAL MAMMOGRAM WITH TOMO AND CAD COMPARISON:  Previous exam(s). ACR Breast Density Category b: There are scattered areas of fibroglandular density. FINDINGS: There are no findings suspicious for malignancy. Images were processed with CAD. IMPRESSION: No mammographic evidence of malignancy. A result letter of this screening mammogram will be mailed directly to the patient. RECOMMENDATION: Screening mammogram in one year. (Code:SM-B-01Y) BI-RADS CATEGORY  1: Negative. Electronically Signed   By: Ammie Ferrier M.D.   On: 05/20/2020 14:27       Assessment & Plan:   Problem List Items Addressed This Visit    Health care maintenance    Physical today 12/29/20. Mammogram  05/20/20 - Birads I.  Colonoscopy 05/2017 (Dr Alice Reichert) - hyperplastic polyp.  Recommended f/u colonoscopy in 5 years.  PAP 12/29/20.        Hypercholesterolemia    Have discussed calculated cholesterol risk.  Desires diet and exercise.  Low cholesterol diet and exercise.  Follow lipid panel.       Relevant Orders   Hepatic function panel   Lipid panel   Hyperglycemia    Low carb diet and exercise.  Follow met b and a1c.       Relevant Orders   Hemoglobin A1c   Hypertension    Blood pressure as outlined.  On amlodipine and hctz.  Follow pressures.  Follow metabolic panel.       Relevant Orders   CBC with Differential/Platelet   Basic metabolic panel   Stress    Increased stress. Overall appears to be handling things well.  Follow.         Other Visit Diagnoses    Routine general medical examination at a health care facility    -  Primary   Cervical cancer screening       Relevant Orders   Cytology - PAP( [place]) (Completed)       Einar Pheasant, MD

## 2020-12-29 NOTE — Assessment & Plan Note (Signed)
Physical today 12/29/20. Mammogram 05/20/20 - Birads I.  Colonoscopy 05/2017 (Dr Alice Reichert) - hyperplastic polyp.  Recommended f/u colonoscopy in 5 years.  PAP 12/29/20.

## 2020-12-30 LAB — CYTOLOGY - PAP
Comment: NEGATIVE
Diagnosis: NEGATIVE
High risk HPV: NEGATIVE

## 2021-01-04 ENCOUNTER — Encounter: Payer: Self-pay | Admitting: Internal Medicine

## 2021-01-04 NOTE — Assessment & Plan Note (Signed)
Increased stress. Overall appears to be handling things well.  Follow.

## 2021-01-04 NOTE — Assessment & Plan Note (Signed)
Low carb diet and exercise.  Follow met b and a1c.  

## 2021-01-04 NOTE — Assessment & Plan Note (Signed)
Have discussed calculated cholesterol risk.  Desires diet and exercise.  Low cholesterol diet and exercise.  Follow lipid panel.

## 2021-01-04 NOTE — Assessment & Plan Note (Signed)
Blood pressure as outlined.  On amlodipine and hctz.  Follow pressures.  Follow metabolic panel.  

## 2021-01-26 ENCOUNTER — Other Ambulatory Visit: Payer: No Typology Code available for payment source

## 2021-02-03 ENCOUNTER — Other Ambulatory Visit: Payer: Self-pay

## 2021-02-03 MED FILL — Sertraline HCl Tab 50 MG: ORAL | 90 days supply | Qty: 90 | Fill #0 | Status: AC

## 2021-02-06 ENCOUNTER — Telehealth: Payer: No Typology Code available for payment source | Admitting: Nurse Practitioner

## 2021-02-06 DIAGNOSIS — H5789 Other specified disorders of eye and adnexa: Secondary | ICD-10-CM

## 2021-02-06 MED ORDER — POLYMYXIN B-TRIMETHOPRIM 10000-0.1 UNIT/ML-% OP SOLN: 2.0000 [drp] | OPHTHALMIC | 0 refills | Status: DC

## 2021-02-06 NOTE — Progress Notes (Signed)
Virtual Visit Consent   Ashley Waller, you are scheduled for a virtual visit with Mary-Margaret Hassell Done, FNP, a [facility] provider, today.     Just as with appointments in the office, your consent must be obtained to participate.  Your consent will be active for this visit and any virtual visit you may have with one of our providers in the next 365 days.     If you have a MyChart account, a copy of this consent can be sent to you electronically.  All virtual visits are billed to your insurance company just like a traditional visit in the office.    As this is a virtual visit, video technology does not allow for your provider to perform a traditional examination.  This may limit your provider's ability to fully assess your condition.  If your provider identifies any concerns that need to be evaluated in person or the need to arrange testing (such as labs, EKG, etc.), we will make arrangements to do so.     Although advances in technology are sophisticated, we cannot ensure that it will always work on either your end or our end.  If the connection with a video visit is poor, the visit may have to be switched to a telephone visit.  With either a video or telephone visit, we are not always able to ensure that we have a secure connection.     I need to obtain your verbal consent now.   Are you willing to proceed with your visit today? YES   Ashley Waller has provided verbal consent on 02/06/2021 for a virtual visit (video or telephone).   Mary-Margaret Hassell Done, FNP   Date: 02/06/2021 5:21 PM   Virtual Visit via Video Note   I, Mary-Margaret Angelie Kram, connected with Ashley Waller ([id number], Mar 03, 1961) on 02/06/21 at  5:30 PM EDT by a video-enabled telemedicine application and verified that I am speaking with the correct person using two identifiers.  Location: Patient: Virtual Visit Location Patient: Home Provider: Virtual Visit Location Provider: Mobile   I discussed the limitations of  evaluation and management by telemedicine and the availability of in person appointments. The patient expressed understanding and agreed to proceed.    History of Present Illness: Ashley Waller is a 60 y.o. who identifies as a female who was assigned female at birth, and is being seen today for left red eye.  HPI: HPI   Patient states that her left eye was slightly red yesterday and developed pain with drainage today. Is light sensitive. Denies being around anyone with pink eye. Problems:  Patient Active Problem List   Diagnosis Date Noted   Tick bite 01/05/2020   Hyperglycemia 06/07/2017   Chest pain 03/13/2016   History of colonic polyps 02/27/2015   Health care maintenance 11/30/2014   Stress 05/17/2014   Plantar fasciitis of left foot 07/15/2013   Hypertension 07/22/2012   Menopausal syndrome 07/22/2012   Hypercholesterolemia 07/16/2012    Allergies:  Allergies  Allergen Reactions   Penicillins    Medications:  Current Outpatient Medications:    acetaminophen (TYLENOL) 325 MG tablet, Take 650 mg by mouth every 6 (six) hours as needed for pain., Disp: , Rfl:    albuterol (VENTOLIN HFA) 108 (90 Base) MCG/ACT inhaler, Inhale 2 puffs into the lungs every 6 (six) hours as needed for wheezing or shortness of breath., Disp: 18 g, Rfl: 1   amLODipine (NORVASC) 5 MG tablet, TAKE 1 TABLET BY MOUTH DAILY., Disp:  90 tablet, Rfl: 1   Cetirizine HCl (ZYRTEC PO), Take by mouth., Disp: , Rfl:    fluticasone (FLONASE) 50 MCG/ACT nasal spray, Place 2 sprays into both nostrils daily. Hold until pt request, Disp: 48 g, Rfl: 1   hydrochlorothiazide (MICROZIDE) 12.5 MG capsule, TAKE 1 CAPSULE BY MOUTH DAILY., Disp: 90 capsule, Rfl: 1   sertraline (ZOLOFT) 50 MG tablet, TAKE 1 TABLET BY MOUTH DAILY., Disp: 90 tablet, Rfl: 1  Observations/Objective: Patient is well-developed, well-nourished in no acute distress.  Resting comfortably  at home.  Head is normocephalic, atraumatic.  No labored  breathing.  Speech is clear and coherent with logical content.  Patient is alert and oriented at baseline.  Left sclera injection  Assessment and Plan Ashley Waller in today with chief complaint of No chief complaint on file.   1. Redness of left eye Cool compresses Avoid rubbing eye.  Good handwashing  Meds ordered this encounter  Medications   trimethoprim-polymyxin b (POLYTRIM) ophthalmic solution    Sig: Place 2 drops into both eyes every 4 (four) hours.    Dispense:  10 mL    Refill:  0    Order Specific Question:   Supervising Provider    Answer:   Noemi Chapel [[id number]]       Follow Up Instructions: I discussed the assessment and treatment plan with the patient. The patient was provided an opportunity to ask questions and all were answered. The patient agreed with the plan and demonstrated an understanding of the instructions.  A copy of instructions were sent to the patient via MyChart.  The patient was advised to call back or seek an in-person evaluation if the symptoms worsen or if the condition fails to improve as anticipated.  Time:  I spent 10 minutes with the patient via telehealth technology discussing the above problems/concerns.    Mary-Margaret Hassell Done, FNP

## 2021-02-08 ENCOUNTER — Encounter: Payer: Self-pay | Admitting: Internal Medicine

## 2021-02-08 DIAGNOSIS — H5710 Ocular pain, unspecified eye: Secondary | ICD-10-CM

## 2021-02-08 DIAGNOSIS — H5789 Other specified disorders of eye and adnexa: Secondary | ICD-10-CM

## 2021-02-09 NOTE — Telephone Encounter (Signed)
I have placed an order for urgent consult to [facility].  See referral message.  Thank you.

## 2021-02-10 ENCOUNTER — Other Ambulatory Visit (INDEPENDENT_AMBULATORY_CARE_PROVIDER_SITE_OTHER): Payer: No Typology Code available for payment source

## 2021-02-10 ENCOUNTER — Other Ambulatory Visit: Payer: Self-pay

## 2021-02-10 DIAGNOSIS — R739 Hyperglycemia, unspecified: Secondary | ICD-10-CM

## 2021-02-10 DIAGNOSIS — I10 Essential (primary) hypertension: Secondary | ICD-10-CM

## 2021-02-10 DIAGNOSIS — E7800 Pure hypercholesterolemia, unspecified: Secondary | ICD-10-CM

## 2021-02-10 LAB — LIPID PANEL
Cholesterol: 204 mg/dL — ABNORMAL HIGH (ref 0–200)
HDL: 51.7 mg/dL (ref >39.00)
LDL Cholesterol: 121 mg/dL — ABNORMAL HIGH (ref 0–99)
NonHDL: 152.24
Total CHOL/HDL Ratio: 4
Triglycerides: 156 mg/dL — ABNORMAL HIGH (ref 0.0–149.0)
VLDL: 31.2 mg/dL (ref 0.0–40.0)

## 2021-02-10 LAB — CBC WITH DIFFERENTIAL/PLATELET
Basophils Absolute: 0.1 10*3/uL (ref 0.0–0.1)
Basophils Relative: 0.9 % (ref 0.0–3.0)
Eosinophils Absolute: 0.2 10*3/uL (ref 0.0–0.7)
Eosinophils Relative: 2.8 % (ref 0.0–5.0)
HCT: 37.1 % (ref 36.0–46.0)
Hemoglobin: 12.9 g/dL (ref 12.0–15.0)
Lymphocytes Relative: 37.2 % (ref 12.0–46.0)
Lymphs Abs: 2.1 10*3/uL (ref 0.7–4.0)
MCHC: 34.8 g/dL (ref 30.0–36.0)
MCV: 87 fl (ref 78.0–100.0)
Monocytes Absolute: 0.4 10*3/uL (ref 0.1–1.0)
Monocytes Relative: 7.6 % (ref 3.0–12.0)
Neutro Abs: 2.9 10*3/uL (ref 1.4–7.7)
Neutrophils Relative %: 51.5 % (ref 43.0–77.0)
Platelets: 329 10*3/uL (ref 150.0–400.0)
RBC: 4.26 Mil/uL (ref 3.87–5.11)
RDW: 12.6 % (ref 11.5–15.5)
WBC: 5.7 10*3/uL (ref 4.0–10.5)

## 2021-02-10 LAB — BASIC METABOLIC PANEL
BUN: 13 mg/dL (ref 6–23)
CO2: 30 mEq/L (ref 19–32)
Calcium: 9 mg/dL (ref 8.4–10.5)
Chloride: 99 mEq/L (ref 96–112)
Creatinine, Ser: 0.62 mg/dL (ref 0.40–1.20)
GFR: 95.42 mL/min (ref >60.00)
Glucose, Bld: 113 mg/dL — ABNORMAL HIGH (ref 70–99)
Potassium: 3.5 mEq/L (ref 3.5–5.1)
Sodium: 136 mEq/L (ref 135–145)

## 2021-02-10 LAB — HEMOGLOBIN A1C: Hgb A1c MFr Bld: 5.8 % (ref 4.6–6.5)

## 2021-02-10 LAB — HEPATIC FUNCTION PANEL
ALT: 12 U/L (ref 0–35)
AST: 14 U/L (ref 0–37)
Albumin: 4.2 g/dL (ref 3.5–5.2)
Alkaline Phosphatase: 75 U/L (ref 39–117)
Bilirubin, Direct: 0.1 mg/dL (ref 0.0–0.3)
Total Bilirubin: 0.5 mg/dL (ref 0.2–1.2)
Total Protein: 6.8 g/dL (ref 6.0–8.3)

## 2021-02-10 NOTE — Telephone Encounter (Signed)
Please see her message.  She apparently was told they did not have the information for the referral.  Thank you for your help.

## 2021-02-12 ENCOUNTER — Telehealth: Payer: Self-pay | Admitting: Internal Medicine

## 2021-02-12 NOTE — Telephone Encounter (Signed)
Spoke with pt and she stated that she was never able to connect with [facility] but that she feels that over all her eye is doing better. Pt stated that the redness has decreased. She also stated that yesterday she was in front of a computer screen for a while and after that she started having pain in her eyes. She stated that she is taking tylenol for that and it is helping. Pt is going to give the rx drops to more time and call [facility] to schedule a regular follow up appt.

## 2021-02-12 NOTE — Telephone Encounter (Signed)
Rejection Reason - Patient did not respond - We left a message twice and have not heard anything back from this patient. We do have notes so if they happen to call back we can get them scheduled" Docia Barrier said on Feb 12, 2021 10:55 AM  "NURSE LM " Katy Apo said on Feb 11, 2021 8:39 AM  Msg from [facility]

## 2021-02-12 NOTE — Telephone Encounter (Signed)
Please call Geri and see how she is doing.  She called in with acute issues with her eyes.  They have been trying to get in touch with her.  Please confirm if doing ok.

## 2021-02-15 ENCOUNTER — Other Ambulatory Visit: Payer: Self-pay

## 2021-02-15 MED ORDER — CYCLOPENTOLATE HCL 1 % OP SOLN
1.0000 [drp] | Freq: Three times a day (TID) | OPHTHALMIC | 0 refills | Status: DC
  Filled 2021-02-15: qty 2, 10d supply, fill #0
  Filled 2021-02-15: qty 2, 14d supply, fill #0

## 2021-02-15 MED ORDER — PREDNISOLONE ACETATE 1 % OP SUSP
OPHTHALMIC | 0 refills | Status: DC
  Filled 2021-02-15: qty 10, 37d supply, fill #0

## 2021-02-15 NOTE — Telephone Encounter (Signed)
Called patient to follow up. Patient waited through the weekend to see if the rx drops would clear it up. She is still having problems this morning. Called [facility] to schedule her an appointment per patient request. Receptionist stated that message has been sent back to nurse and they will be reaching out to to the patient before lunch time to schedule her for first available acute appt. Patient is aware of this and will let me know if she has not heard anything by this afternoon.

## 2021-02-15 NOTE — Telephone Encounter (Signed)
PT called to advise that she has been having Dr.Scott assist her with getting into a [facility] do not have the referral yet and she is wanting a update on what is going on and would like a callback.

## 2021-03-05 ENCOUNTER — Other Ambulatory Visit: Payer: Self-pay

## 2021-03-05 MED FILL — Amlodipine Besylate Tab 5 MG (Base Equivalent): ORAL | 90 days supply | Qty: 90 | Fill #0 | Status: AC

## 2021-03-11 ENCOUNTER — Other Ambulatory Visit: Payer: Self-pay

## 2021-03-11 MED ORDER — VALACYCLOVIR HCL 1 G PO TABS
1000.0000 mg | ORAL_TABLET | Freq: Three times a day (TID) | ORAL | 0 refills | Status: DC
  Filled 2021-03-11: qty 21, 7d supply, fill #0

## 2021-03-12 ENCOUNTER — Other Ambulatory Visit: Payer: Self-pay

## 2021-03-12 MED ORDER — PREDNISOLONE ACETATE 1 % OP SUSP
OPHTHALMIC | 0 refills | Status: DC
  Filled 2021-03-12: qty 10, 37d supply, fill #0

## 2021-03-12 MED ORDER — CYCLOPENTOLATE HCL 1 % OP SOLN
OPHTHALMIC | 0 refills | Status: DC
  Filled 2021-03-12: qty 2, 10d supply, fill #0

## 2021-03-29 ENCOUNTER — Other Ambulatory Visit: Payer: Self-pay

## 2021-03-29 ENCOUNTER — Other Ambulatory Visit: Payer: Self-pay | Admitting: Internal Medicine

## 2021-03-30 ENCOUNTER — Other Ambulatory Visit: Payer: Self-pay

## 2021-03-30 MED FILL — Hydrochlorothiazide Cap 12.5 MG: ORAL | 90 days supply | Qty: 90 | Fill #0 | Status: AC

## 2021-04-01 ENCOUNTER — Other Ambulatory Visit: Payer: Self-pay | Admitting: Internal Medicine

## 2021-04-01 ENCOUNTER — Other Ambulatory Visit: Payer: Self-pay

## 2021-04-01 MED FILL — Hydrochlorothiazide Cap 12.5 MG: ORAL | Qty: 90 | Fill #0 | Status: CN

## 2021-04-29 ENCOUNTER — Encounter: Payer: Self-pay | Admitting: Internal Medicine

## 2021-05-03 ENCOUNTER — Other Ambulatory Visit: Payer: Self-pay

## 2021-05-03 ENCOUNTER — Encounter: Payer: Self-pay | Admitting: Internal Medicine

## 2021-05-03 ENCOUNTER — Ambulatory Visit (INDEPENDENT_AMBULATORY_CARE_PROVIDER_SITE_OTHER): Payer: No Typology Code available for payment source | Admitting: Internal Medicine

## 2021-05-03 VITALS — BP 126/82 | HR 71 | Temp 95.9°F | Ht 60.98 in | Wt 194.6 lb

## 2021-05-03 DIAGNOSIS — H209 Unspecified iridocyclitis: Secondary | ICD-10-CM

## 2021-05-03 DIAGNOSIS — E7800 Pure hypercholesterolemia, unspecified: Secondary | ICD-10-CM | POA: Diagnosis not present

## 2021-05-03 DIAGNOSIS — Z1231 Encounter for screening mammogram for malignant neoplasm of breast: Secondary | ICD-10-CM

## 2021-05-03 DIAGNOSIS — F439 Reaction to severe stress, unspecified: Secondary | ICD-10-CM

## 2021-05-03 DIAGNOSIS — R739 Hyperglycemia, unspecified: Secondary | ICD-10-CM

## 2021-05-03 DIAGNOSIS — I10 Essential (primary) hypertension: Secondary | ICD-10-CM

## 2021-05-03 LAB — C-REACTIVE PROTEIN: CRP: <1 mg/dL (ref 0.5–20.0)

## 2021-05-03 LAB — CBC WITH DIFFERENTIAL/PLATELET
Basophils Absolute: 0.1 10*3/uL (ref 0.0–0.1)
Basophils Relative: 1.2 % (ref 0.0–3.0)
Eosinophils Absolute: 0.1 10*3/uL (ref 0.0–0.7)
Eosinophils Relative: 1.6 % (ref 0.0–5.0)
HCT: 40.6 % (ref 36.0–46.0)
Hemoglobin: 13.6 g/dL (ref 12.0–15.0)
Lymphocytes Relative: 40.5 % (ref 12.0–46.0)
Lymphs Abs: 1.9 10*3/uL (ref 0.7–4.0)
MCHC: 33.4 g/dL (ref 30.0–36.0)
MCV: 89.1 fl (ref 78.0–100.0)
Monocytes Absolute: 0.3 10*3/uL (ref 0.1–1.0)
Monocytes Relative: 7.2 % (ref 3.0–12.0)
Neutro Abs: 2.3 10*3/uL (ref 1.4–7.7)
Neutrophils Relative %: 49.5 % (ref 43.0–77.0)
Platelets: 330 10*3/uL (ref 150.0–400.0)
RBC: 4.55 Mil/uL (ref 3.87–5.11)
RDW: 13 % (ref 11.5–15.5)
WBC: 4.6 10*3/uL (ref 4.0–10.5)

## 2021-05-03 LAB — HEPATIC FUNCTION PANEL
ALT: 10 U/L (ref 0–35)
AST: 13 U/L (ref 0–37)
Albumin: 4.5 g/dL (ref 3.5–5.2)
Alkaline Phosphatase: 80 U/L (ref 39–117)
Bilirubin, Direct: 0.1 mg/dL (ref 0.0–0.3)
Total Bilirubin: 0.5 mg/dL (ref 0.2–1.2)
Total Protein: 7 g/dL (ref 6.0–8.3)

## 2021-05-03 LAB — BASIC METABOLIC PANEL
BUN: 8 mg/dL (ref 6–23)
CO2: 27 mEq/L (ref 19–32)
Calcium: 9.5 mg/dL (ref 8.4–10.5)
Chloride: 102 mEq/L (ref 96–112)
Creatinine, Ser: 0.6 mg/dL (ref 0.40–1.20)
GFR: 96.03 mL/min (ref >60.00)
Glucose, Bld: 98 mg/dL (ref 70–99)
Potassium: 4.2 mEq/L (ref 3.5–5.1)
Sodium: 140 mEq/L (ref 135–145)

## 2021-05-03 LAB — TSH: TSH: 2.9 u[IU]/mL (ref 0.35–5.50)

## 2021-05-03 LAB — SEDIMENTATION RATE: Sed Rate: 31 mm/hr — ABNORMAL HIGH (ref 0–30)

## 2021-05-03 LAB — LIPASE: Lipase: 76 U/L — ABNORMAL HIGH (ref 11.0–59.0)

## 2021-05-03 NOTE — Progress Notes (Signed)
Patient ID: RAHIMA FLEISHMAN, female   DOB: April 27, 1961, 60 y.o.   MRN: [id number]   Subjective:    Patient ID: Floydene Flock, female    DOB: 1961-05-22, 60 y.o.   MRN: [id number]  This visit occurred during the SARS-CoV-2 public health emergency.  Safety protocols were in place, including screening questions prior to the visit, additional usage of staff PPE, and extensive cleaning of exam room while observing appropriate contact time as indicated for disinfecting solutions.   Patient here for a scheduled follow up.   Chief Complaint  Patient presents with   Follow-up   Eye Pain   .   HPI Recently diagnosed with iritis.  Has been seeing ophthalmology.  Treated with steroid drops, etc.  States eye will improve when on drops, but when stops, symptoms return.  Noticed issues after having covid.  Last flare started last week - noticed increased swelling.  She is back on prednisone drops now and is better.  She was also treated with valtrex - finished within the last two weeks.  Increased stress related to above.  Discussed.  Discussed seeing a counselor.  Will notify me if feels needs anything further.  No chest pain or sob reported.  Eating.  Bowels moving.  Also reports itching/psoriasis.     Past Medical History:  Diagnosis Date   Abnormal mammogram 10/15/2012   Adult BMI 35.0-35.9 kg/sq m 01/31/2017   Arthritis    Asthma    Chest pain 03/13/2016   Health care maintenance 11/30/2014   Mammogram 12/22/15 - Birads 0.  Recommended f/u right breat mammogram.  Follow up right breast mammogram 01/11/16 - Birads I.  Recommended f/u mammogram in one year.      Hematuria    Hemorrhoid    History of colonic polyps 02/27/2015   Hypercholesterolemia 07/16/2012   Hypertension    Menopausal syndrome 07/22/2012   Plantar fasciitis of left foot 07/15/2013   Stress 05/17/2014   Past Surgical History:  Procedure Laterality Date   COLONOSCOPY  2013   EYELID LACERATION REPAIR  2006   TUBAL LIGATION  1995    Family History  Problem Relation Age of Onset   Hyperlipidemia Mother    Diabetes Mother    Hypertension Mother    Hyperlipidemia Father    Diabetes Father    Hypertension Father    Mental illness Maternal Grandmother    Mental illness Maternal Grandfather    Mental illness Paternal Grandmother    Mental illness Paternal Grandfather    Breast cancer Neg Hx    Social History   Socioeconomic History   Marital status: Married    Spouse name: Not on file   Number of children: Not on file   Years of education: Not on file   Highest education level: Not on file  Occupational History   Not on file  Tobacco Use   Smoking status: Never   Smokeless tobacco: Never  Substance and Sexual Activity   Alcohol use: Yes    Alcohol/week: 0.0 - 1.0 standard drinks    Comment: ocassionally   Drug use: No   Sexual activity: Not on file  Other Topics Concern   Not on file  Social History Narrative   Not on file   Social Determinants of Health   Financial Resource Strain: Not on file  Food Insecurity: Not on file  Transportation Needs: Not on file  Physical Activity: Not on file  Stress: Not on file  Social Connections: Not on  file     Review of Systems  Constitutional:  Negative for appetite change and unexpected weight change.       No fever.   HENT:  Negative for congestion and sinus pressure.   Eyes:  Positive for pain and redness.  Respiratory:  Negative for cough, chest tightness and shortness of breath.   Cardiovascular:  Negative for chest pain, palpitations and leg swelling.  Gastrointestinal:  Negative for diarrhea and vomiting.  Genitourinary:  Negative for difficulty urinating and dysuria.  Musculoskeletal:  Negative for joint swelling and myalgias.  Skin:  Negative for color change and rash.  Neurological:  Negative for dizziness, light-headedness and headaches.  Psychiatric/Behavioral:  Negative for agitation and dysphoric mood.        Increased stress as  outlined.       Objective:     BP 126/82   Pulse 71   Temp (!) 95.9 F (35.5 C)   Ht 5' 0.98" (1.549 m)   Wt 194 lb 9.6 oz (88.3 kg)   LMP 07/16/2009   SpO2 97%   BMI 36.79 kg/m  Wt Readings from Last 3 Encounters:  05/03/21 194 lb 9.6 oz (88.3 kg)  12/29/20 191 lb 9.6 oz (86.9 kg)  08/28/20 192 lb 9.6 oz (87.4 kg)    Physical Exam Vitals reviewed.  Constitutional:      General: She is not in acute distress.    Appearance: Normal appearance.  HENT:     Head: Normocephalic and atraumatic.     Right Ear: External ear normal.     Left Ear: External ear normal.  Eyes:     General: No scleral icterus.       Right eye: No discharge.        Left eye: No discharge.     Conjunctiva/sclera: Conjunctivae normal.  Neck:     Thyroid: No thyromegaly.  Cardiovascular:     Rate and Rhythm: Normal rate and regular rhythm.  Pulmonary:     Effort: No respiratory distress.     Breath sounds: Normal breath sounds. No wheezing.  Abdominal:     General: Bowel sounds are normal.     Palpations: Abdomen is soft.     Tenderness: There is no abdominal tenderness.  Musculoskeletal:        General: No swelling or tenderness.     Cervical back: Neck supple. No tenderness.  Lymphadenopathy:     Cervical: No cervical adenopathy.  Skin:    Findings: No erythema or rash.  Neurological:     Mental Status: She is alert.  Psychiatric:        Mood and Affect: Mood normal.        Behavior: Behavior normal.     Outpatient Encounter Medications as of 05/03/2021  Medication Sig   acetaminophen (TYLENOL) 325 MG tablet Take 650 mg by mouth every 6 (six) hours as needed for pain.   albuterol (VENTOLIN HFA) 108 (90 Base) MCG/ACT inhaler Inhale 2 puffs into the lungs every 6 (six) hours as needed for wheezing or shortness of breath.   amLODipine (NORVASC) 5 MG tablet TAKE 1 TABLET BY MOUTH DAILY.   Cetirizine HCl (ZYRTEC PO) Take by mouth.   cyclopentolate (CYCLODRYL,CYCLOGYL) 1 % ophthalmic  solution Place 1 drop into the left eye 3 (three) times daily.   cyclopentolate (CYCLODRYL,CYCLOGYL) 1 % ophthalmic solution Instill one drop into the left eye three times daily   fluticasone (FLONASE) 50 MCG/ACT nasal spray Place 2 sprays into both nostrils  daily. Hold until pt request   hydrochlorothiazide (MICROZIDE) 12.5 MG capsule TAKE 1 CAPSULE BY MOUTH DAILY.   prednisoLONE acetate (PRED FORTE) 1 % ophthalmic suspension Apply 1 drop into left eye four times a day   sertraline (ZOLOFT) 50 MG tablet TAKE 1 TABLET BY MOUTH DAILY.   trimethoprim-polymyxin b (POLYTRIM) ophthalmic solution Place 2 drops into both eyes every 4 (four) hours.   valACYclovir (VALTREX) 1000 MG tablet Take 1 tablet by mouth three times a day   No facility-administered encounter medications on file as of 05/03/2021.     Lab Results  Component Value Date   WBC 4.6 05/03/2021   HGB 13.6 05/03/2021   HCT 40.6 05/03/2021   PLT 330.0 05/03/2021   GLUCOSE 98 05/03/2021   CHOL 204 (H) 02/10/2021   TRIG 156.0 (H) 02/10/2021   HDL 51.70 02/10/2021   LDLDIRECT 150.0 08/26/2020   LDLCALC 121 (H) 02/10/2021   ALT 10 05/03/2021   AST 13 05/03/2021   NA 140 05/03/2021   K 4.2 05/03/2021   CL 102 05/03/2021   CREATININE 0.60 05/03/2021   BUN 8 05/03/2021   CO2 27 05/03/2021   TSH 2.90 05/03/2021   HGBA1C 5.8 02/10/2021       Assessment & Plan:   Problem List Items Addressed This Visit     Hypercholesterolemia - Primary    The 10-year ASCVD risk score (Arnett DK, et al., 2019) is: 5.5%   Values used to calculate the score:     Age: 24 years     Sex: Female     Is Non-Hispanic African American: No     Diabetic: No     Tobacco smoker: No     Systolic Blood Pressure: 810 mmHg     Is BP treated: Yes     HDL Cholesterol: 51.7 mg/dL     Total Cholesterol: 204 mg/dL  Low cholesterol diet and exercise.  Follow lipid panel.       Relevant Orders   CBC with Differential/Platelet (Completed)   Hepatic  function panel (Completed)   TSH (Completed)   Basic metabolic panel (Completed)   Hyperglycemia    Low carb diet and exercise.  Follow met b and a1c.       Hypertension    Blood pressure as outlined.  On amlodipine and hctz.  Follow pressures.  Follow metabolic panel.       Iritis    Recently diagnosed with iritis.  Discussed.  She request labs to f/u on outside "testing".  Has been seeing ophthalmology.  Using prednisone eye drops.  Recently treated with valtrex.  Better today.  Follow.  Notify me if flares.        Relevant Orders   Sedimentation rate (Completed)   C-reactive protein (Completed)   Lipase (Completed)   Stress    Increased stress as outlined.  Discussed.  Does not feel needs any further intervention at this time.  Follow.       Other Visit Diagnoses     Encounter for screening mammogram for malignant neoplasm of breast       Relevant Orders   MM 3D SCREEN BREAST BILATERAL        Einar Pheasant, MD

## 2021-05-06 ENCOUNTER — Telehealth: Payer: Self-pay

## 2021-05-06 ENCOUNTER — Encounter: Payer: Self-pay | Admitting: Internal Medicine

## 2021-05-06 NOTE — Telephone Encounter (Signed)
-----   Message from Einar Pheasant, MD sent at 05/06/2021  9:00 AM EDT ----- Notify Kathalene that her inflammatory markers are ok.  Lipase level is slightly elevated.  This can be non specific.  Need to confirm if having an GI symptoms (nausea, abdominal pain, change in appetite, etc).  Hgb, thyroid test, kidney function tests and liver function tests are wnl.

## 2021-05-06 NOTE — Assessment & Plan Note (Signed)
Low carb diet and exercise.  Follow met b and a1c.  

## 2021-05-06 NOTE — Assessment & Plan Note (Signed)
Recently diagnosed with iritis.  Discussed.  She request labs to f/u on outside "testing".  Has been seeing ophthalmology.  Using prednisone eye drops.  Recently treated with valtrex.  Better today.  Follow.  Notify me if flares.

## 2021-05-06 NOTE — Assessment & Plan Note (Signed)
Increased stress as outlined.  Discussed.  Does not feel needs any further intervention at this time.  Follow.  

## 2021-05-06 NOTE — Assessment & Plan Note (Signed)
The 10-year ASCVD risk score (Arnett DK, et al., 2019) is: 5.5%   Values used to calculate the score:     Age: 60 years     Sex: Female     Is Non-Hispanic African American: No     Diabetic: No     Tobacco smoker: No     Systolic Blood Pressure: 063 mmHg     Is BP treated: Yes     HDL Cholesterol: 51.7 mg/dL     Total Cholesterol: 204 mg/dL  Low cholesterol diet and exercise.  Follow lipid panel.

## 2021-05-06 NOTE — Telephone Encounter (Signed)
LMTCB to call back regarding labs

## 2021-05-06 NOTE — Assessment & Plan Note (Signed)
Blood pressure as outlined.  On amlodipine and hctz.  Follow pressures.  Follow metabolic panel.  

## 2021-05-20 ENCOUNTER — Other Ambulatory Visit: Payer: Self-pay

## 2021-05-20 ENCOUNTER — Ambulatory Visit
Admission: RE | Admit: 2021-05-20 | Discharge: 2021-05-20 | Disposition: A | Discharge status: Home / Self Care | Payer: No Typology Code available for payment source | Source: Ambulatory Visit | Attending: Internal Medicine | Admitting: Internal Medicine

## 2021-05-20 DIAGNOSIS — Z1231 Encounter for screening mammogram for malignant neoplasm of breast: Secondary | ICD-10-CM | POA: Diagnosis not present

## 2021-05-25 ENCOUNTER — Encounter: Payer: Self-pay | Admitting: Internal Medicine

## 2021-05-25 NOTE — Telephone Encounter (Signed)
Given her issues with her eye and what she is describing (and since she previously has taken valtrex), I would recommend her contacting her ophthalmologist.  They have been following her for this.  Let us know if persistent problems.

## 2021-05-25 NOTE — Telephone Encounter (Signed)
Left message for patient to return call back.  

## 2021-05-31 ENCOUNTER — Other Ambulatory Visit: Payer: Self-pay | Admitting: Internal Medicine

## 2021-05-31 ENCOUNTER — Other Ambulatory Visit: Payer: Self-pay

## 2021-05-31 MED FILL — Sertraline HCl Tab 50 MG: ORAL | 90 days supply | Qty: 90 | Fill #0 | Status: AC

## 2021-06-08 ENCOUNTER — Other Ambulatory Visit: Payer: Self-pay

## 2021-06-08 ENCOUNTER — Ambulatory Visit (INDEPENDENT_AMBULATORY_CARE_PROVIDER_SITE_OTHER): Payer: No Typology Code available for payment source | Admitting: Internal Medicine

## 2021-06-08 VITALS — BP 126/76 | HR 82 | Temp 97.2°F | Resp 16 | Ht 61.0 in | Wt 191.8 lb

## 2021-06-08 DIAGNOSIS — Z114 Encounter for screening for human immunodeficiency virus [HIV]: Secondary | ICD-10-CM | POA: Diagnosis not present

## 2021-06-08 DIAGNOSIS — Z86010 Personal history of colonic polyps: Secondary | ICD-10-CM

## 2021-06-08 DIAGNOSIS — E7800 Pure hypercholesterolemia, unspecified: Secondary | ICD-10-CM

## 2021-06-08 DIAGNOSIS — Z1159 Encounter for screening for other viral diseases: Secondary | ICD-10-CM

## 2021-06-08 DIAGNOSIS — R739 Hyperglycemia, unspecified: Secondary | ICD-10-CM | POA: Diagnosis not present

## 2021-06-08 DIAGNOSIS — I10 Essential (primary) hypertension: Secondary | ICD-10-CM

## 2021-06-08 DIAGNOSIS — H209 Unspecified iridocyclitis: Secondary | ICD-10-CM

## 2021-06-08 DIAGNOSIS — F439 Reaction to severe stress, unspecified: Secondary | ICD-10-CM

## 2021-06-08 MED ORDER — ACYCLOVIR 400 MG PO TABS
400.0000 mg | ORAL_TABLET | Freq: Three times a day (TID) | ORAL | 0 refills | Status: DC
  Filled 2021-06-08: qty 15, 5d supply, fill #0

## 2021-06-08 NOTE — Progress Notes (Signed)
Patient ID: Ashley Waller, female   DOB: [birth year], 60 y.o.   MRN: [id number]   Subjective:    Patient ID: Ashley Waller, female    DOB: June 03, 1961, 60 y.o.   MRN: [id number]  This visit occurred during the SARS-CoV-2 public health emergency.  Safety protocols were in place, including screening questions prior to the visit, additional usage of staff PPE, and extensive cleaning of exam room while observing appropriate contact time as indicated for disinfecting solutions.   Patient here for a scheduled follow up.   Chief Complaint  Patient presents with   Mouth Lesions   Hypertension   .   HPI Has been having issues with iritis.  See last note.  Has seen ophthalmology.  They had prescribed acyclovir. Helped.  She was requesting to have another rx if needed.  Currently eyes are better.  She is off steroid drops.  Using refresh tears prn.  Planning to f/u with her regular eye MD.  No vision change or pain currently.  No chest pain or sob.  No acid reflux.  No abdominal pain.  Bowels moving.  Handling stress.    Past Medical History:  Diagnosis Date   Abnormal mammogram 10/15/2012   Adult BMI 35.0-35.9 kg/sq m 01/31/2017   Arthritis    Asthma    Chest pain 03/13/2016   Health care maintenance 11/30/2014   Mammogram 12/22/15 - Birads 0.  Recommended f/u right breat mammogram.  Follow up right breast mammogram 01/11/16 - Birads I.  Recommended f/u mammogram in one year.      Hematuria    Hemorrhoid    History of colonic polyps 02/27/2015   Hypercholesterolemia 07/16/2012   Hypertension    Menopausal syndrome 07/22/2012   Plantar fasciitis of left foot 07/15/2013   Stress 05/17/2014   Past Surgical History:  Procedure Laterality Date   COLONOSCOPY  2013   EYELID LACERATION REPAIR  2006   TUBAL LIGATION  1995   Family History  Problem Relation Age of Onset   Hyperlipidemia Mother    Diabetes Mother    Hypertension Mother    Hyperlipidemia Father    Diabetes Father    Hypertension  Father    Mental illness Maternal Grandmother    Mental illness Maternal Grandfather    Mental illness Paternal Grandmother    Mental illness Paternal Grandfather    Breast cancer Neg Hx    Social History   Socioeconomic History   Marital status: Married    Spouse name: Not on file   Number of children: Not on file   Years of education: Not on file   Highest education level: Not on file  Occupational History   Not on file  Tobacco Use   Smoking status: Never   Smokeless tobacco: Never  Substance and Sexual Activity   Alcohol use: Yes    Alcohol/week: 0.0 - 1.0 standard drinks    Comment: ocassionally   Drug use: No   Sexual activity: Not on file  Other Topics Concern   Not on file  Social History Narrative   Not on file   Social Determinants of Health   Financial Resource Strain: Not on file  Food Insecurity: Not on file  Transportation Needs: Not on file  Physical Activity: Not on file  Stress: Not on file  Social Connections: Not on file     Review of Systems  Constitutional:  Negative for appetite change and unexpected weight change.  HENT:  Negative for congestion and  sinus pressure.   Respiratory:  Negative for cough, chest tightness and shortness of breath.   Cardiovascular:  Negative for chest pain, palpitations and leg swelling.  Gastrointestinal:  Negative for abdominal pain, diarrhea, nausea and vomiting.  Genitourinary:  Negative for difficulty urinating and dysuria.  Musculoskeletal:  Negative for joint swelling and myalgias.  Skin:  Negative for color change and rash.  Neurological:  Negative for dizziness, light-headedness and headaches.  Psychiatric/Behavioral:  Negative for agitation and dysphoric mood.       Objective:     BP 126/76   Pulse 82   Temp (!) 97.2 F (36.2 C)   Resp 16   Ht 5' 1"  (1.549 m)   Wt 191 lb 12.8 oz (87 kg)   LMP 07/16/2009   SpO2 99%   BMI 36.24 kg/m  Wt Readings from Last 3 Encounters:  06/08/21 191 lb 12.8  oz (87 kg)  05/03/21 194 lb 9.6 oz (88.3 kg)  12/29/20 191 lb 9.6 oz (86.9 kg)    Physical Exam Vitals reviewed.  Constitutional:      General: She is not in acute distress.    Appearance: Normal appearance.  HENT:     Head: Normocephalic and atraumatic.     Right Ear: External ear normal.     Left Ear: External ear normal.  Eyes:     General: No scleral icterus.       Right eye: No discharge.        Left eye: No discharge.     Conjunctiva/sclera: Conjunctivae normal.  Neck:     Thyroid: No thyromegaly.  Cardiovascular:     Rate and Rhythm: Normal rate and regular rhythm.  Pulmonary:     Effort: No respiratory distress.     Breath sounds: Normal breath sounds. No wheezing.  Abdominal:     General: Bowel sounds are normal.     Palpations: Abdomen is soft.     Tenderness: There is no abdominal tenderness.  Musculoskeletal:        General: No swelling or tenderness.     Cervical back: Neck supple. No tenderness.  Lymphadenopathy:     Cervical: No cervical adenopathy.  Skin:    Findings: No erythema or rash.  Neurological:     Mental Status: She is alert.  Psychiatric:        Mood and Affect: Mood normal.        Behavior: Behavior normal.     Outpatient Encounter Medications as of 06/08/2021  Medication Sig   acyclovir (ZOVIRAX) 400 MG tablet Take 1 tablet (400 mg total) by mouth 3 (three) times daily.   acetaminophen (TYLENOL) 325 MG tablet Take 650 mg by mouth every 6 (six) hours as needed for pain.   albuterol (VENTOLIN HFA) 108 (90 Base) MCG/ACT inhaler Inhale 2 puffs into the lungs every 6 (six) hours as needed for wheezing or shortness of breath.   Cetirizine HCl (ZYRTEC PO) Take by mouth.   fluticasone (FLONASE) 50 MCG/ACT nasal spray Place 2 sprays into both nostrils daily. Hold until pt request   sertraline (ZOLOFT) 50 MG tablet TAKE 1 TABLET BY MOUTH DAILY.   valACYclovir (VALTREX) 1000 MG tablet Take 1 tablet by mouth three times a day   [DISCONTINUED]  amLODipine (NORVASC) 5 MG tablet TAKE 1 TABLET BY MOUTH DAILY.   [DISCONTINUED] cyclopentolate (CYCLODRYL,CYCLOGYL) 1 % ophthalmic solution Place 1 drop into the left eye 3 (three) times daily.   [DISCONTINUED] cyclopentolate (CYCLODRYL,CYCLOGYL) 1 % ophthalmic solution Instill  one drop into the left eye three times daily   [DISCONTINUED] hydrochlorothiazide (MICROZIDE) 12.5 MG capsule TAKE 1 CAPSULE BY MOUTH DAILY.   [DISCONTINUED] prednisoLONE acetate (PRED FORTE) 1 % ophthalmic suspension Apply 1 drop into left eye four times a day   [DISCONTINUED] trimethoprim-polymyxin b (POLYTRIM) ophthalmic solution Place 2 drops into both eyes every 4 (four) hours.   No facility-administered encounter medications on file as of 06/08/2021.     Lab Results  Component Value Date   WBC 4.6 05/03/2021   HGB 13.6 05/03/2021   HCT 40.6 05/03/2021   PLT 330.0 05/03/2021   GLUCOSE 98 05/03/2021   CHOL 204 (H) 02/10/2021   TRIG 156.0 (H) 02/10/2021   HDL 51.70 02/10/2021   LDLDIRECT 150.0 08/26/2020   LDLCALC 121 (H) 02/10/2021   ALT 10 05/03/2021   AST 13 05/03/2021   NA 140 05/03/2021   K 4.2 05/03/2021   CL 102 05/03/2021   CREATININE 0.60 05/03/2021   BUN 8 05/03/2021   CO2 27 05/03/2021   TSH 2.90 05/03/2021   HGBA1C 5.8 02/10/2021    MM 3D SCREEN BREAST BILATERAL  Result Date: 05/25/2021 CLINICAL DATA:  Screening. EXAM: DIGITAL SCREENING BILATERAL MAMMOGRAM WITH TOMOSYNTHESIS AND CAD TECHNIQUE: Bilateral screening digital craniocaudal and mediolateral oblique mammograms were obtained. Bilateral screening digital breast tomosynthesis was performed. The images were evaluated with computer-aided detection. COMPARISON:  Previous exam(s). ACR Breast Density Category b: There are scattered areas of fibroglandular density. FINDINGS: There are no findings suspicious for malignancy. IMPRESSION: No mammographic evidence of malignancy. A result letter of this screening mammogram will be mailed directly  to the patient. RECOMMENDATION: Screening mammogram in one year. (Code:SM-B-01Y) BI-RADS CATEGORY  1: Negative. Electronically Signed   By: Everlean Alstrom M.D.   On: 05/25/2021 13:13      Assessment & Plan:   Problem List Items Addressed This Visit     History of colonic polyps    Colonoscopy 05/2017 - hyperplastic polyp.  Recommended f/u in 5 years.  (Dr Alice Reichert)      Hypercholesterolemia - Primary    The 10-year ASCVD risk score (Arnett DK, et al., 2019) is: 5.5%   Values used to calculate the score:     Age: 40 years     Sex: Female     Is Non-Hispanic African American: No     Diabetic: No     Tobacco smoker: No     Systolic Blood Pressure: 179 mmHg     Is BP treated: Yes     HDL Cholesterol: 51.7 mg/dL     Total Cholesterol: 204 mg/dL  Low cholesterol diet and exercise.  Follow lipid panel.       Relevant Orders   Hepatic function panel   Lipid panel   Hyperglycemia    Low carb diet and exercise.  Follow met b and a1c.       Relevant Orders   Hemoglobin A1c   Hypertension    Blood pressure as outlined.  On amlodipine and hctz.  Follow pressures.  Follow metabolic panel.       Relevant Orders   Basic metabolic panel   Iritis    Recently diagnosed.  Off steroid drops.  Eye is doing better now.  rx given to have for acyclovir if needed - can see if helps oral lesions.  Recommended f/u with eye MD to recheck and follow pressure and to determine if any further w/up warranted regarding iritis.  Stress    Increased stress. Overall appears to be doing better.  Does not feel needs any further intervention at this time.  Follow.       Other Visit Diagnoses     Screening for HIV without presence of risk factors       Relevant Medications   acyclovir (ZOVIRAX) 400 MG tablet   Other Relevant Orders   HIV antibody (with reflex)   Encounter for hepatitis C screening test for low risk patient       Relevant Orders   Hepatitis C antibody        Einar Pheasant, MD

## 2021-06-10 ENCOUNTER — Other Ambulatory Visit: Payer: Self-pay

## 2021-06-10 ENCOUNTER — Ambulatory Visit: Payer: No Typology Code available for payment source | Attending: Internal Medicine

## 2021-06-10 DIAGNOSIS — Z23 Encounter for immunization: Secondary | ICD-10-CM

## 2021-06-10 MED ORDER — PFIZER COVID-19 VAC BIVALENT 30 MCG/0.3ML IM SUSP
INTRAMUSCULAR | 0 refills | Status: DC
  Filled 2021-06-10: qty 0.3, 1d supply, fill #0

## 2021-06-11 ENCOUNTER — Other Ambulatory Visit: Payer: Self-pay

## 2021-06-11 ENCOUNTER — Encounter: Payer: Self-pay | Admitting: Internal Medicine

## 2021-06-11 MED ORDER — AMLODIPINE BESYLATE 5 MG PO TABS
ORAL_TABLET | Freq: Every day | ORAL | 1 refills | Status: DC
  Filled 2021-06-11: qty 90, 90d supply, fill #0
  Filled 2021-09-13: qty 90, 90d supply, fill #1

## 2021-06-11 MED ORDER — HYDROCHLOROTHIAZIDE 12.5 MG PO CAPS
ORAL_CAPSULE | Freq: Every day | ORAL | 1 refills | Status: DC
  Filled 2021-06-11: qty 90, 90d supply, fill #0
  Filled 2021-10-18: qty 90, 90d supply, fill #1

## 2021-06-13 ENCOUNTER — Encounter: Payer: Self-pay | Admitting: Internal Medicine

## 2021-06-13 NOTE — Assessment & Plan Note (Signed)
Low carb diet and exercise.  Follow met b and a1c.  

## 2021-06-13 NOTE — Assessment & Plan Note (Signed)
Colonoscopy 05/2017 - hyperplastic polyp.  Recommended f/u in 5 years.  (Dr Alice Reichert)

## 2021-06-13 NOTE — Assessment & Plan Note (Signed)
Recently diagnosed.  Off steroid drops.  Eye is doing better now.  rx given to have for acyclovir if needed - can see if helps oral lesions.  Recommended f/u with eye MD to recheck and follow pressure and to determine if any further w/up warranted regarding iritis.

## 2021-06-13 NOTE — Assessment & Plan Note (Signed)
Increased stress. Overall appears to be doing better.  Does not feel needs any further intervention at this time.  Follow.

## 2021-06-13 NOTE — Assessment & Plan Note (Signed)
The 10-year ASCVD risk score (Arnett DK, et al., 2019) is: 5.5%   Values used to calculate the score:     Age: 60 years     Sex: Female     Is Non-Hispanic African American: No     Diabetic: No     Tobacco smoker: No     Systolic Blood Pressure: 265 mmHg     Is BP treated: Yes     HDL Cholesterol: 51.7 mg/dL     Total Cholesterol: 204 mg/dL  Low cholesterol diet and exercise.  Follow lipid panel.

## 2021-06-13 NOTE — Assessment & Plan Note (Signed)
Blood pressure as outlined.  On amlodipine and hctz.  Follow pressures.  Follow metabolic panel.  

## 2021-06-30 ENCOUNTER — Other Ambulatory Visit (INDEPENDENT_AMBULATORY_CARE_PROVIDER_SITE_OTHER): Payer: No Typology Code available for payment source

## 2021-06-30 ENCOUNTER — Other Ambulatory Visit: Payer: Self-pay

## 2021-06-30 DIAGNOSIS — Z114 Encounter for screening for human immunodeficiency virus [HIV]: Secondary | ICD-10-CM

## 2021-06-30 DIAGNOSIS — I10 Essential (primary) hypertension: Secondary | ICD-10-CM

## 2021-06-30 DIAGNOSIS — R739 Hyperglycemia, unspecified: Secondary | ICD-10-CM | POA: Diagnosis not present

## 2021-06-30 DIAGNOSIS — E7800 Pure hypercholesterolemia, unspecified: Secondary | ICD-10-CM | POA: Diagnosis not present

## 2021-06-30 DIAGNOSIS — Z1159 Encounter for screening for other viral diseases: Secondary | ICD-10-CM

## 2021-06-30 LAB — BASIC METABOLIC PANEL
BUN: 12 mg/dL (ref 6–23)
CO2: 30 mEq/L (ref 19–32)
Calcium: 9.2 mg/dL (ref 8.4–10.5)
Chloride: 100 mEq/L (ref 96–112)
Creatinine, Ser: 0.62 mg/dL (ref 0.40–1.20)
GFR: 95.16 mL/min (ref >60.00)
Glucose, Bld: 107 mg/dL — ABNORMAL HIGH (ref 70–99)
Potassium: 3.5 mEq/L (ref 3.5–5.1)
Sodium: 138 mEq/L (ref 135–145)

## 2021-06-30 LAB — LIPID PANEL
Cholesterol: 220 mg/dL — ABNORMAL HIGH (ref 0–200)
HDL: 51.2 mg/dL (ref >39.00)
LDL Cholesterol: 129 mg/dL — ABNORMAL HIGH (ref 0–99)
NonHDL: 169.12
Total CHOL/HDL Ratio: 4
Triglycerides: 200 mg/dL — ABNORMAL HIGH (ref 0.0–149.0)
VLDL: 40 mg/dL (ref 0.0–40.0)

## 2021-06-30 LAB — HEPATIC FUNCTION PANEL
ALT: 11 U/L (ref 0–35)
AST: 14 U/L (ref 0–37)
Albumin: 4.3 g/dL (ref 3.5–5.2)
Alkaline Phosphatase: 68 U/L (ref 39–117)
Bilirubin, Direct: 0.1 mg/dL (ref 0.0–0.3)
Total Bilirubin: 0.5 mg/dL (ref 0.2–1.2)
Total Protein: 6.6 g/dL (ref 6.0–8.3)

## 2021-06-30 LAB — HEMOGLOBIN A1C: Hgb A1c MFr Bld: 5.7 % (ref 4.6–6.5)

## 2021-07-01 LAB — HEPATITIS C ANTIBODY
Hepatitis C Ab: NONREACTIVE
SIGNAL TO CUT-OFF: 0.03 (ref <1.00)

## 2021-07-01 LAB — HIV ANTIBODY (ROUTINE TESTING W REFLEX): HIV 1&2 Ab, 4th Generation: NONREACTIVE

## 2021-08-17 ENCOUNTER — Encounter: Payer: Self-pay | Admitting: Internal Medicine

## 2021-08-17 DIAGNOSIS — M25561 Pain in right knee: Secondary | ICD-10-CM

## 2021-08-19 NOTE — Telephone Encounter (Signed)
yes

## 2021-08-19 NOTE — Telephone Encounter (Signed)
Gave pt information for [facility] walk in to be evaluated more acutely. In the mean time, patient requesting referral to [facility] for right knee pain. This has been going on since 07/01/21. Ok to place referral

## 2021-08-25 ENCOUNTER — Other Ambulatory Visit: Payer: Self-pay

## 2021-08-25 MED ORDER — MELOXICAM 7.5 MG PO TABS
ORAL_TABLET | ORAL | 1 refills | Status: DC
  Filled 2021-08-25: qty 60, 30d supply, fill #0

## 2021-09-13 ENCOUNTER — Other Ambulatory Visit: Payer: Self-pay

## 2021-09-13 MED FILL — Sertraline HCl Tab 50 MG: ORAL | 90 days supply | Qty: 90 | Fill #1 | Status: AC

## 2021-10-13 ENCOUNTER — Ambulatory Visit (INDEPENDENT_AMBULATORY_CARE_PROVIDER_SITE_OTHER): Payer: No Typology Code available for payment source | Admitting: Internal Medicine

## 2021-10-13 ENCOUNTER — Other Ambulatory Visit: Payer: Self-pay

## 2021-10-13 DIAGNOSIS — H209 Unspecified iridocyclitis: Secondary | ICD-10-CM

## 2021-10-13 DIAGNOSIS — R739 Hyperglycemia, unspecified: Secondary | ICD-10-CM | POA: Diagnosis not present

## 2021-10-13 DIAGNOSIS — F439 Reaction to severe stress, unspecified: Secondary | ICD-10-CM

## 2021-10-13 DIAGNOSIS — I10 Essential (primary) hypertension: Secondary | ICD-10-CM | POA: Diagnosis not present

## 2021-10-13 DIAGNOSIS — Z86010 Personal history of colonic polyps: Secondary | ICD-10-CM

## 2021-10-13 DIAGNOSIS — E7800 Pure hypercholesterolemia, unspecified: Secondary | ICD-10-CM

## 2021-10-13 MED ORDER — LORAZEPAM 0.5 MG PO TABS
ORAL_TABLET | ORAL | 0 refills | Status: DC
  Filled 2021-10-13: qty 10, 20d supply, fill #0

## 2021-10-13 NOTE — Progress Notes (Signed)
Patient ID: Ashley Waller, female   DOB: July 15, 1961, 61 y.o.   MRN: [id number] ? ? ?Subjective:  ? ? Patient ID: Ashley Waller, female    DOB: 03-12-1961, 61 y.o.   MRN: [id number] ? ?This visit occurred during the SARS-CoV-2 public health emergency.  Safety protocols were in place, including screening questions prior to the visit, additional usage of staff PPE, and extensive cleaning of exam room while observing appropriate contact time as indicated for disinfecting solutions.  ? ?Patient here for a scheduled follow up.  ? ?Chief Complaint  ?Patient presents with  ? Depression  ? Hypertension  ? Hyperlipidemia  ? .  ? ?HPI ?Follow up appt.  Increased stress.  Work stress. Discussed.  On zoloft.  Discussed adjusting dose.  Has gone to [facility].  Planning for f/u.  Exercising some.  No chest pain or sob reported.  No abdominal pain or bowel change reported.  No cough or congestion.  Wants to hold on shingles vaccine.  Taking melatonin gummies.   ? ? ?Past Medical History:  ?Diagnosis Date  ? Abnormal mammogram 10/15/2012  ? Adult BMI 35.0-35.9 kg/sq m 01/31/2017  ? Arthritis   ? Asthma   ? Chest pain 03/13/2016  ? Health care maintenance 11/30/2014  ? Mammogram 12/22/15 - Birads 0.  Recommended f/u right breat mammogram.  Follow up right breast mammogram 01/11/16 - Birads I.  Recommended f/u mammogram in one year.     ? Hematuria   ? Hemorrhoid   ? History of colonic polyps 02/27/2015  ? Hypercholesterolemia 07/16/2012  ? Hypertension   ? Menopausal syndrome 07/22/2012  ? Plantar fasciitis of left foot 07/15/2013  ? Stress 05/17/2014  ? ?Past Surgical History:  ?Procedure Laterality Date  ? COLONOSCOPY  2013  ? EYELID LACERATION REPAIR  2006  ? TUBAL LIGATION  1995  ? ?Family History  ?Problem Relation Age of Onset  ? Hyperlipidemia Mother   ? Diabetes Mother   ? Hypertension Mother   ? Hyperlipidemia Father   ? Diabetes Father   ? Hypertension Father   ? Mental illness Maternal Grandmother   ? Mental illness Maternal Grandfather   ?  Mental illness Paternal Grandmother   ? Mental illness Paternal Grandfather   ? Breast cancer Neg Hx   ? ?Social History  ? ?Socioeconomic History  ? Marital status: Married  ?  Spouse name: Not on file  ? Number of children: Not on file  ? Years of education: Not on file  ? Highest education level: Not on file  ?Occupational History  ? Not on file  ?Tobacco Use  ? Smoking status: Never  ? Smokeless tobacco: Never  ?Substance and Sexual Activity  ? Alcohol use: Yes  ?  Alcohol/week: 0.0 - 1.0 standard drinks  ?  Comment: ocassionally  ? Drug use: No  ? Sexual activity: Not on file  ?Other Topics Concern  ? Not on file  ?Social History Narrative  ? Not on file  ? ?Social Determinants of Health  ? ?Financial Resource Strain: Not on file  ?Food Insecurity: Not on file  ?Transportation Needs: Not on file  ?Physical Activity: Not on file  ?Stress: Not on file  ?Social Connections: Not on file  ? ? ? ?Review of Systems  ?Constitutional:  Negative for appetite change and unexpected weight change.  ?HENT:  Negative for congestion and sinus pressure.   ?Respiratory:  Negative for cough, chest tightness and shortness of breath.   ?Cardiovascular:  Negative for chest pain, palpitations and leg swelling.  ?Gastrointestinal:  Negative for abdominal pain, diarrhea, nausea and vomiting.  ?Genitourinary:  Negative for difficulty urinating and dysuria.  ?Musculoskeletal:  Negative for joint swelling and myalgias.  ?Skin:  Negative for color change and rash.  ?Neurological:  Negative for dizziness, light-headedness and headaches.  ?Psychiatric/Behavioral:  Negative for agitation and dysphoric mood.   ?     Increased stress as outlined.   ? ?   ?Objective:  ?  ? ?BP 126/72   Pulse 80   Temp 97.9 ?F (36.6 ?C)   Resp 16   Ht '5\' 1"'$  (1.549 m)   Wt 191 lb (86.6 kg)   LMP 07/16/2009   SpO2 98%   BMI 36.09 kg/m?  ?Wt Readings from Last 3 Encounters:  ?10/13/21 191 lb (86.6 kg)  ?06/08/21 191 lb 12.8 oz (87 kg)  ?05/03/21 194 lb 9.6  oz (88.3 kg)  ? ? ?Physical Exam ?Vitals reviewed.  ?Constitutional:   ?   General: She is not in acute distress. ?   Appearance: Normal appearance.  ?HENT:  ?   Head: Normocephalic and atraumatic.  ?   Right Ear: External ear normal.  ?   Left Ear: External ear normal.  ?Eyes:  ?   General: No scleral icterus.    ?   Right eye: No discharge.     ?   Left eye: No discharge.  ?   Conjunctiva/sclera: Conjunctivae normal.  ?Neck:  ?   Thyroid: No thyromegaly.  ?Cardiovascular:  ?   Rate and Rhythm: Normal rate and regular rhythm.  ?Pulmonary:  ?   Effort: No respiratory distress.  ?   Breath sounds: Normal breath sounds. No wheezing.  ?Abdominal:  ?   General: Bowel sounds are normal.  ?   Palpations: Abdomen is soft.  ?   Tenderness: There is no abdominal tenderness.  ?Musculoskeletal:     ?   General: No swelling or tenderness.  ?   Cervical back: Neck supple. No tenderness.  ?Lymphadenopathy:  ?   Cervical: No cervical adenopathy.  ?Skin: ?   Findings: No erythema or rash.  ?Neurological:  ?   Mental Status: She is alert.  ?Psychiatric:     ?   Mood and Affect: Mood normal.     ?   Behavior: Behavior normal.  ? ? ? ?Outpatient Encounter Medications as of 10/13/2021  ?Medication Sig  ? LORazepam (ATIVAN) 0.5 MG tablet Take 1/2 tablet q day prn  ? acetaminophen (TYLENOL) 325 MG tablet Take 650 mg by mouth every 6 (six) hours as needed for pain.  ? albuterol (VENTOLIN HFA) 108 (90 Base) MCG/ACT inhaler Inhale 2 puffs into the lungs every 6 (six) hours as needed for wheezing or shortness of breath.  ? amLODipine (NORVASC) 5 MG tablet TAKE 1 TABLET BY MOUTH DAILY.  ? Cetirizine HCl (ZYRTEC PO) Take by mouth.  ? fluticasone (FLONASE) 50 MCG/ACT nasal spray Place 2 sprays into both nostrils daily. Hold until pt request  ? hydrochlorothiazide (MICROZIDE) 12.5 MG capsule TAKE 1 CAPSULE BY MOUTH DAILY.  ? sertraline (ZOLOFT) 50 MG tablet TAKE 1 TABLET BY MOUTH DAILY.  ? [DISCONTINUED] acyclovir (ZOVIRAX) 400 MG tablet Take 1  tablet (400 mg total) by mouth 3 (three) times daily.  ? [DISCONTINUED] COVID-19 mRNA bivalent vaccine, Pfizer, (PFIZER COVID-19 VAC BIVALENT) injection Inject into the muscle.  ? [DISCONTINUED] meloxicam (MOBIC) 7.5 MG tablet Take 1 tablet twice a day by oral  route.  ? [DISCONTINUED] valACYclovir (VALTREX) 1000 MG tablet Take 1 tablet by mouth three times a day  ? ?No facility-administered encounter medications on file as of 10/13/2021.  ?  ? ?Lab Results  ?Component Value Date  ? WBC 4.6 05/03/2021  ? HGB 13.6 05/03/2021  ? HCT 40.6 05/03/2021  ? PLT 330.0 05/03/2021  ? GLUCOSE 107 (H) 06/30/2021  ? CHOL 220 (H) 06/30/2021  ? TRIG 200.0 (H) 06/30/2021  ? HDL 51.20 06/30/2021  ? LDLDIRECT 150.0 08/26/2020  ? [place] 129 (H) 06/30/2021  ? ALT 11 06/30/2021  ? AST 14 06/30/2021  ? NA 138 06/30/2021  ? K 3.5 06/30/2021  ? CL 100 06/30/2021  ? CREATININE 0.62 06/30/2021  ? BUN 12 06/30/2021  ? CO2 30 06/30/2021  ? TSH 2.90 05/03/2021  ? HGBA1C 5.7 06/30/2021  ? ? ?MM 3D SCREEN BREAST BILATERAL ? ?Result Date: 05/25/2021 ?CLINICAL DATA:  Screening. EXAM: DIGITAL SCREENING BILATERAL MAMMOGRAM WITH TOMOSYNTHESIS AND CAD TECHNIQUE: Bilateral screening digital craniocaudal and mediolateral oblique mammograms were obtained. Bilateral screening digital breast tomosynthesis was performed. The images were evaluated with computer-aided detection. COMPARISON:  Previous exam(s). ACR Breast Density Category b: There are scattered areas of fibroglandular density. FINDINGS: There are no findings suspicious for malignancy. IMPRESSION: No mammographic evidence of malignancy. A result letter of this screening mammogram will be mailed directly to the patient. RECOMMENDATION: Screening mammogram in one year. (Code:SM-B-01Y) BI-RADS CATEGORY  1: Negative. Electronically Signed   By: Everlean Alstrom M.D.   On: 05/25/2021 13:13 ? ? ?   ?Assessment & Plan:  ? ?Problem List Items Addressed This Visit   ? ? History of colonic polyps  ?   Colonoscopy 05/2017 - hyperplastic polyp.  Recommended f/u in 5 years.  (Dr Alice Reichert) ?  ?  ? Hypercholesterolemia  ?  The 10-year ASCVD risk score (Arnett DK, et al., 2019) is: 6.5% ?  Values used to calculate the scor

## 2021-10-17 ENCOUNTER — Encounter: Payer: Self-pay | Admitting: Internal Medicine

## 2021-10-17 NOTE — Assessment & Plan Note (Signed)
The 10-year ASCVD risk score (Arnett DK, et al., 2019) is: 6.5% ?  Values used to calculate the score: ?    Age: 61 years ?    Sex: Female ?    Is Non-Hispanic African American: No ?    Diabetic: No ?    Tobacco smoker: No ?    Systolic Blood Pressure: 549 mmHg ?    Is BP treated: Yes ?    HDL Cholesterol: 51.2 mg/dL ?    Total Cholesterol: 220 mg/dL  ?Low cholesterol diet and exercise.  Follow lipid panel.  ?

## 2021-10-17 NOTE — Assessment & Plan Note (Signed)
Colonoscopy 05/2017 - hyperplastic polyp.  Recommended f/u in 5 years.  (Dr Alice Reichert) ?

## 2021-10-17 NOTE — Assessment & Plan Note (Signed)
Blood pressure as outlined.  On amlodipine and hctz.  Follow pressures.  Follow metabolic panel.  

## 2021-10-17 NOTE — Assessment & Plan Note (Signed)
Increased stress.  Discussed.  On zoloft. Discussed adjusting the dose.  Notify me if desires to increase.  Follow.   ?

## 2021-10-17 NOTE — Assessment & Plan Note (Signed)
Overall appears to be doing better.  Has eye drops if needed.  Discussed f/u with ophthalmology.  ?

## 2021-10-17 NOTE — Assessment & Plan Note (Signed)
Low carb diet and exercise.  Follow met b and a1c.  ?

## 2021-10-18 ENCOUNTER — Other Ambulatory Visit: Payer: Self-pay

## 2021-11-19 ENCOUNTER — Other Ambulatory Visit (INDEPENDENT_AMBULATORY_CARE_PROVIDER_SITE_OTHER): Payer: No Typology Code available for payment source

## 2021-11-19 DIAGNOSIS — R739 Hyperglycemia, unspecified: Secondary | ICD-10-CM | POA: Diagnosis not present

## 2021-11-19 DIAGNOSIS — I10 Essential (primary) hypertension: Secondary | ICD-10-CM | POA: Diagnosis not present

## 2021-11-19 DIAGNOSIS — E7800 Pure hypercholesterolemia, unspecified: Secondary | ICD-10-CM | POA: Diagnosis not present

## 2021-11-19 LAB — LIPID PANEL
Cholesterol: 230 mg/dL — ABNORMAL HIGH (ref 0–200)
HDL: 49.2 mg/dL (ref >39.00)
NonHDL: 180.46
Total CHOL/HDL Ratio: 5
Triglycerides: 203 mg/dL — ABNORMAL HIGH (ref 0.0–149.0)
VLDL: 40.6 mg/dL — ABNORMAL HIGH (ref 0.0–40.0)

## 2021-11-19 LAB — BASIC METABOLIC PANEL
BUN: 17 mg/dL (ref 6–23)
CO2: 28 mEq/L (ref 19–32)
Calcium: 9.4 mg/dL (ref 8.4–10.5)
Chloride: 100 mEq/L (ref 96–112)
Creatinine, Ser: 0.69 mg/dL (ref 0.40–1.20)
GFR: 92.49 mL/min (ref >60.00)
Glucose, Bld: 114 mg/dL — ABNORMAL HIGH (ref 70–99)
Potassium: 4.2 mEq/L (ref 3.5–5.1)
Sodium: 137 mEq/L (ref 135–145)

## 2021-11-19 LAB — HEPATIC FUNCTION PANEL
ALT: 11 U/L (ref 0–35)
AST: 13 U/L (ref 0–37)
Albumin: 4.3 g/dL (ref 3.5–5.2)
Alkaline Phosphatase: 70 U/L (ref 39–117)
Bilirubin, Direct: 0.1 mg/dL (ref 0.0–0.3)
Total Bilirubin: 0.7 mg/dL (ref 0.2–1.2)
Total Protein: 7.1 g/dL (ref 6.0–8.3)

## 2021-11-19 LAB — LDL CHOLESTEROL, DIRECT: Direct LDL: 166 mg/dL

## 2021-11-19 LAB — HEMOGLOBIN A1C: Hgb A1c MFr Bld: 5.6 % (ref 4.6–6.5)

## 2021-11-25 ENCOUNTER — Other Ambulatory Visit: Payer: Self-pay

## 2021-11-25 MED ORDER — CYCLOPENTOLATE HCL 1 % OP SOLN
OPHTHALMIC | 0 refills | Status: DC
  Filled 2021-11-25: qty 2, 10d supply, fill #0

## 2021-12-23 ENCOUNTER — Other Ambulatory Visit: Payer: Self-pay | Admitting: Internal Medicine

## 2021-12-23 ENCOUNTER — Encounter: Payer: Self-pay | Admitting: Internal Medicine

## 2021-12-23 ENCOUNTER — Other Ambulatory Visit: Payer: Self-pay

## 2021-12-23 MED ORDER — SERTRALINE HCL 50 MG PO TABS: ORAL_TABLET | Freq: Every day | ORAL | 1 refills | Status: DC

## 2021-12-23 MED ORDER — AMLODIPINE BESYLATE 5 MG PO TABS: ORAL_TABLET | Freq: Every day | ORAL | 1 refills | Status: DC

## 2021-12-23 MED ORDER — HYDROCHLOROTHIAZIDE 12.5 MG PO CAPS: ORAL_CAPSULE | Freq: Every day | ORAL | 1 refills | Status: DC

## 2022-01-19 ENCOUNTER — Encounter: Payer: Self-pay | Admitting: Internal Medicine

## 2022-02-14 ENCOUNTER — Encounter: Payer: No Typology Code available for payment source | Admitting: Internal Medicine

## 2022-02-16 ENCOUNTER — Other Ambulatory Visit: Payer: Self-pay | Admitting: Internal Medicine

## 2022-03-17 ENCOUNTER — Encounter: Payer: No Typology Code available for payment source | Admitting: Internal Medicine

## 2022-04-05 ENCOUNTER — Encounter: Payer: Self-pay | Admitting: Internal Medicine

## 2022-04-05 ENCOUNTER — Ambulatory Visit (INDEPENDENT_AMBULATORY_CARE_PROVIDER_SITE_OTHER): Payer: 59 | Admitting: Internal Medicine

## 2022-04-05 VITALS — BP 110/70 | HR 88 | Temp 98.2°F | Ht 61.0 in | Wt 197.8 lb

## 2022-04-05 DIAGNOSIS — F439 Reaction to severe stress, unspecified: Secondary | ICD-10-CM | POA: Diagnosis not present

## 2022-04-05 DIAGNOSIS — Z860100 Personal history of colon polyps, unspecified: Secondary | ICD-10-CM

## 2022-04-05 DIAGNOSIS — R739 Hyperglycemia, unspecified: Secondary | ICD-10-CM

## 2022-04-05 DIAGNOSIS — E7800 Pure hypercholesterolemia, unspecified: Secondary | ICD-10-CM

## 2022-04-05 DIAGNOSIS — Z0000 Encounter for general adult medical examination without abnormal findings: Secondary | ICD-10-CM

## 2022-04-05 DIAGNOSIS — Z1211 Encounter for screening for malignant neoplasm of colon: Secondary | ICD-10-CM

## 2022-04-05 DIAGNOSIS — Z23 Encounter for immunization: Secondary | ICD-10-CM | POA: Diagnosis not present

## 2022-04-05 DIAGNOSIS — Z1231 Encounter for screening mammogram for malignant neoplasm of breast: Secondary | ICD-10-CM | POA: Diagnosis not present

## 2022-04-05 DIAGNOSIS — I10 Essential (primary) hypertension: Secondary | ICD-10-CM

## 2022-04-05 MED ORDER — HYDROCHLOROTHIAZIDE 12.5 MG PO CAPS: ORAL_CAPSULE | Freq: Every day | ORAL | 1 refills | Status: DC

## 2022-04-05 MED ORDER — AMLODIPINE BESYLATE 5 MG PO TABS: ORAL_TABLET | Freq: Every day | ORAL | 1 refills | Status: DC

## 2022-04-05 NOTE — Assessment & Plan Note (Signed)
Physical today 04/05/22.  Mammogram 05/2021 - Birads I.  Colonoscopy 05/2017.  Recommended f/u in 5 years.  PAP 12/29/20 - negative with negative HPV.

## 2022-04-05 NOTE — Patient Instructions (Signed)
Mammogram 05/23/22 - 7:40 am.

## 2022-04-05 NOTE — Progress Notes (Signed)
Patient ID: Ashley Waller, female   DOB: 06-12-1961, 61 y.o.   MRN: [id number]   Subjective:    Patient ID: Ashley Waller, female    DOB: [birth year], 61 y.o.   MRN: [id number]   Patient here for  Chief Complaint  Patient presents with   Annual Exam   .   HPI Reports she is doing relatively well.  Tries to stay active.  No chest pain or sob reported.  No abdominal pain or bowel change reported.  On zoloft.  Appears to be handling stress relatively well.     Past Medical History:  Diagnosis Date   Abnormal mammogram 10/15/2012   Adult BMI 35.0-35.9 kg/sq m 01/31/2017   Arthritis    Asthma    Chest pain 03/13/2016   Health care maintenance 11/30/2014   Mammogram 12/22/15 - Birads 0.  Recommended f/u right breat mammogram.  Follow up right breast mammogram 01/11/16 - Birads I.  Recommended f/u mammogram in one year.      Hematuria    Hemorrhoid    History of colonic polyps 02/27/2015   Hypercholesterolemia 07/16/2012   Hypertension    Menopausal syndrome 07/22/2012   Plantar fasciitis of left foot 07/15/2013   Stress 05/17/2014   Past Surgical History:  Procedure Laterality Date   COLONOSCOPY  2013   EYELID LACERATION REPAIR  2006   TUBAL LIGATION  1995   Family History  Problem Relation Age of Onset   Hyperlipidemia Mother    Diabetes Mother    Hypertension Mother    Hyperlipidemia Father    Diabetes Father    Hypertension Father    Mental illness Maternal Grandmother    Mental illness Maternal Grandfather    Mental illness Paternal Grandmother    Mental illness Paternal Grandfather    Breast cancer Neg Hx    Social History   Socioeconomic History   Marital status: Married    Spouse name: Not on file   Number of children: Not on file   Years of education: Not on file   Highest education level: Not on file  Occupational History   Not on file  Tobacco Use   Smoking status: Never   Smokeless tobacco: Never  Substance and Sexual Activity   Alcohol use: Yes     Alcohol/week: 0.0 - 1.0 standard drinks of alcohol    Comment: ocassionally   Drug use: No   Sexual activity: Not on file  Other Topics Concern   Not on file  Social History Narrative   Not on file   Social Determinants of Health   Financial Resource Strain: Not on file  Food Insecurity: Not on file  Transportation Needs: Not on file  Physical Activity: Not on file  Stress: Not on file  Social Connections: Not on file     Review of Systems  Constitutional:  Negative for appetite change and unexpected weight change.  HENT:  Negative for congestion, sinus pressure and sore throat.   Eyes:  Negative for pain and visual disturbance.  Respiratory:  Negative for cough, chest tightness and shortness of breath.   Cardiovascular:  Negative for chest pain, palpitations and leg swelling.  Gastrointestinal:  Negative for abdominal pain, diarrhea, nausea and vomiting.  Genitourinary:  Negative for difficulty urinating and dysuria.  Musculoskeletal:  Negative for joint swelling and myalgias.  Skin:  Negative for color change and rash.  Neurological:  Negative for dizziness, light-headedness and headaches.  Hematological:  Negative for adenopathy. Does not bruise/bleed  easily.  Psychiatric/Behavioral:  Negative for agitation and dysphoric mood.        Objective:     BP 110/70 (BP Location: Right Arm, Patient Position: Sitting, Cuff Size: Normal)   Pulse 88   Temp 98.2 F (36.8 C) (Oral)   Ht 5' 1" (1.549 m)   Wt 197 lb 12.8 oz (89.7 kg)   LMP 07/16/2009   SpO2 97%   BMI 37.37 kg/m  Wt Readings from Last 3 Encounters:  04/05/22 197 lb 12.8 oz (89.7 kg)  10/13/21 191 lb (86.6 kg)  06/08/21 191 lb 12.8 oz (87 kg)    Physical Exam Vitals reviewed.  Constitutional:      General: She is not in acute distress.    Appearance: Normal appearance. She is well-developed.  HENT:     Head: Normocephalic and atraumatic.     Right Ear: External ear normal.     Left Ear: External ear  normal.  Eyes:     General: No scleral icterus.       Right eye: No discharge.        Left eye: No discharge.     Conjunctiva/sclera: Conjunctivae normal.  Neck:     Thyroid: No thyromegaly.  Cardiovascular:     Rate and Rhythm: Normal rate and regular rhythm.  Pulmonary:     Effort: No tachypnea, accessory muscle usage or respiratory distress.     Breath sounds: Normal breath sounds. No decreased breath sounds or wheezing.  Chest:  Breasts:    Right: No inverted nipple, mass, nipple discharge or tenderness (no axillary adenopathy).     Left: No inverted nipple, mass, nipple discharge or tenderness (no axilarry adenopathy).  Abdominal:     General: Bowel sounds are normal.     Palpations: Abdomen is soft.     Tenderness: There is no abdominal tenderness.  Musculoskeletal:        General: No swelling or tenderness.     Cervical back: Neck supple.  Lymphadenopathy:     Cervical: No cervical adenopathy.  Skin:    Findings: No erythema or rash.  Neurological:     Mental Status: She is alert and oriented to person, place, and time.  Psychiatric:        Mood and Affect: Mood normal.        Behavior: Behavior normal.      Outpatient Encounter Medications as of 04/05/2022  Medication Sig   acetaminophen (TYLENOL) 325 MG tablet Take 650 mg by mouth every 6 (six) hours as needed for pain.   albuterol (VENTOLIN HFA) 108 (90 Base) MCG/ACT inhaler Inhale 2 puffs into the lungs every 6 (six) hours as needed for wheezing or shortness of breath.   Cetirizine HCl (ZYRTEC PO) Take by mouth.   cyclopentolate (CYCLODRYL,CYCLOGYL) 1 % ophthalmic solution Instill one drop into the left eye three times daily   fluticasone (FLONASE) 50 MCG/ACT nasal spray Place 2 sprays into both nostrils daily. Hold until pt request   LORazepam (ATIVAN) 0.5 MG tablet Take 1/2 tablet q day prn   sertraline (ZOLOFT) 50 MG tablet TAKE 1 TABLET BY MOUTH EVERY DAY   [DISCONTINUED] amLODipine (NORVASC) 5 MG tablet  TAKE 1 TABLET BY MOUTH DAILY.   [DISCONTINUED] hydrochlorothiazide (MICROZIDE) 12.5 MG capsule TAKE 1 CAPSULE BY MOUTH DAILY.   amLODipine (NORVASC) 5 MG tablet TAKE 1 TABLET BY MOUTH DAILY.   hydrochlorothiazide (MICROZIDE) 12.5 MG capsule TAKE 1 CAPSULE BY MOUTH DAILY.   No facility-administered encounter medications on file  as of 04/05/2022.     Lab Results  Component Value Date   WBC 4.6 05/03/2021   HGB 13.6 05/03/2021   HCT 40.6 05/03/2021   PLT 330.0 05/03/2021   GLUCOSE 114 (H) 11/19/2021   CHOL 230 (H) 11/19/2021   TRIG 203.0 (H) 11/19/2021   HDL 49.20 11/19/2021   LDLDIRECT 166.0 11/19/2021   LDLCALC 129 (H) 06/30/2021   ALT 11 11/19/2021   AST 13 11/19/2021   NA 137 11/19/2021   K 4.2 11/19/2021   CL 100 11/19/2021   CREATININE 0.69 11/19/2021   BUN 17 11/19/2021   CO2 28 11/19/2021   TSH 2.90 05/03/2021   HGBA1C 5.6 11/19/2021    MM 3D SCREEN BREAST BILATERAL  Result Date: 05/25/2021 CLINICAL DATA:  Screening. EXAM: DIGITAL SCREENING BILATERAL MAMMOGRAM WITH TOMOSYNTHESIS AND CAD TECHNIQUE: Bilateral screening digital craniocaudal and mediolateral oblique mammograms were obtained. Bilateral screening digital breast tomosynthesis was performed. The images were evaluated with computer-aided detection. COMPARISON:  Previous exam(s). ACR Breast Density Category b: There are scattered areas of fibroglandular density. FINDINGS: There are no findings suspicious for malignancy. IMPRESSION: No mammographic evidence of malignancy. A result letter of this screening mammogram will be mailed directly to the patient. RECOMMENDATION: Screening mammogram in one year. (Code:SM-B-01Y) BI-RADS CATEGORY  1: Negative. Electronically Signed   By: Everlean Alstrom M.D.   On: 05/25/2021 13:13      Assessment & Plan:   Problem List Items Addressed This Visit     Health care maintenance    Physical today 04/05/22.  Mammogram 05/2021 - Birads I.  Colonoscopy 05/2017.  Recommended f/u in 5  years.  PAP 12/29/20 - negative with negative HPV.       History of colonic polyps    Colonoscopy 05/2017 - hyperplastic polyp.  Recommended f/u in 5 years.  (Dr Alice Reichert).  Due f/u.        Hypercholesterolemia    The 10-year ASCVD risk score (Arnett DK, et al., 2019) is: 5.2%   Values used to calculate the score:     Age: 26 years     Sex: Female     Is Non-Hispanic African American: No     Diabetic: No     Tobacco smoker: No     Systolic Blood Pressure: 161 mmHg     Is BP treated: Yes     HDL Cholesterol: 49.2 mg/dL     Total Cholesterol: 230 mg/dL  Low cholesterol diet and exercise.  Follow lipid panel.       Relevant Medications   amLODipine (NORVASC) 5 MG tablet   hydrochlorothiazide (MICROZIDE) 12.5 MG capsule   Other Relevant Orders   CBC with Differential/Platelet   Hepatic function panel   Lipid panel   TSH   Hyperglycemia    Low carb diet and exercise.  Follow met b and a1c.       Relevant Orders   Hemoglobin A1c   Hypertension    Blood pressure as outlined.  On amlodipine and hctz.  Follow pressures.  Follow metabolic panel.       Relevant Medications   amLODipine (NORVASC) 5 MG tablet   hydrochlorothiazide (MICROZIDE) 12.5 MG capsule   Other Relevant Orders   Basic metabolic panel   Stress    Continue zoloft.  Overall appears to be handling things well.  Follow.       Other Visit Diagnoses     Routine general medical examination at a health care facility    -  Primary   Need for immunization against influenza       Relevant Orders   Flu Vaccine QUAD 33moIM (Fluarix, Fluzone & Alfiuria Quad PF) (Completed)   Encounter for screening mammogram for malignant neoplasm of breast       Relevant Orders   MM 3D SCREEN BREAST BILATERAL   Colon cancer screening       Relevant Orders   Ambulatory referral to Gastroenterology        CEinar Pheasant MD

## 2022-04-10 ENCOUNTER — Encounter: Payer: Self-pay | Admitting: Internal Medicine

## 2022-04-10 NOTE — Assessment & Plan Note (Signed)
Blood pressure as outlined.  On amlodipine and hctz.  Follow pressures.  Follow metabolic panel.  

## 2022-04-10 NOTE — Assessment & Plan Note (Signed)
The 10-year ASCVD risk score (Arnett DK, et al., 2019) is: 5.2%   Values used to calculate the score:     Age: 61 years     Sex: Female     Is Non-Hispanic African American: No     Diabetic: No     Tobacco smoker: No     Systolic Blood Pressure: 502 mmHg     Is BP treated: Yes     HDL Cholesterol: 49.2 mg/dL     Total Cholesterol: 230 mg/dL  Low cholesterol diet and exercise.  Follow lipid panel.

## 2022-04-10 NOTE — Assessment & Plan Note (Addendum)
Colonoscopy 05/2017 - hyperplastic polyp.  Recommended f/u in 5 years.  (Dr [place]).  Due f/u.   

## 2022-04-10 NOTE — Assessment & Plan Note (Signed)
Low carb diet and exercise.  Follow met b and a1c.  

## 2022-04-10 NOTE — Assessment & Plan Note (Signed)
Continue zoloft.  Overall appears to be handling things well.  Follow.

## 2022-04-19 ENCOUNTER — Other Ambulatory Visit: Payer: Self-pay

## 2022-04-19 ENCOUNTER — Other Ambulatory Visit (INDEPENDENT_AMBULATORY_CARE_PROVIDER_SITE_OTHER): Payer: 59

## 2022-04-19 ENCOUNTER — Telehealth: Payer: Self-pay

## 2022-04-19 DIAGNOSIS — E7800 Pure hypercholesterolemia, unspecified: Secondary | ICD-10-CM

## 2022-04-19 DIAGNOSIS — R739 Hyperglycemia, unspecified: Secondary | ICD-10-CM | POA: Diagnosis not present

## 2022-04-19 DIAGNOSIS — I10 Essential (primary) hypertension: Secondary | ICD-10-CM | POA: Diagnosis not present

## 2022-04-19 LAB — LDL CHOLESTEROL, DIRECT: Direct LDL: 185 mg/dL

## 2022-04-19 LAB — LIPID PANEL
Cholesterol: 217 mg/dL — ABNORMAL HIGH (ref 0–200)
HDL: 49.2 mg/dL (ref >39.00)
NonHDL: 167.5
Total CHOL/HDL Ratio: 4
Triglycerides: 232 mg/dL — ABNORMAL HIGH (ref 0.0–149.0)
VLDL: 46.4 mg/dL — ABNORMAL HIGH (ref 0.0–40.0)

## 2022-04-19 LAB — CBC WITH DIFFERENTIAL/PLATELET
Basophils Absolute: 0 10*3/uL (ref 0.0–0.1)
Basophils Relative: 0.9 % (ref 0.0–3.0)
Eosinophils Absolute: 0.1 10*3/uL (ref 0.0–0.7)
Eosinophils Relative: 1.7 % (ref 0.0–5.0)
HCT: 38.7 % (ref 36.0–46.0)
Hemoglobin: 13.3 g/dL (ref 12.0–15.0)
Lymphocytes Relative: 37.8 % (ref 12.0–46.0)
Lymphs Abs: 2 10*3/uL (ref 0.7–4.0)
MCHC: 34.3 g/dL (ref 30.0–36.0)
MCV: 87.9 fl (ref 78.0–100.0)
Monocytes Absolute: 0.4 10*3/uL (ref 0.1–1.0)
Monocytes Relative: 7 % (ref 3.0–12.0)
Neutro Abs: 2.8 10*3/uL (ref 1.4–7.7)
Neutrophils Relative %: 52.6 % (ref 43.0–77.0)
Platelets: 309 10*3/uL (ref 150.0–400.0)
RBC: 4.4 Mil/uL (ref 3.87–5.11)
RDW: 12.9 % (ref 11.5–15.5)
WBC: 5.4 10*3/uL (ref 4.0–10.5)

## 2022-04-19 LAB — BASIC METABOLIC PANEL
BUN: 12 mg/dL (ref 6–23)
CO2: 31 mEq/L (ref 19–32)
Calcium: 9.1 mg/dL (ref 8.4–10.5)
Chloride: 100 mEq/L (ref 96–112)
Creatinine, Ser: 0.63 mg/dL (ref 0.40–1.20)
GFR: 94.26 mL/min (ref >60.00)
Glucose, Bld: 112 mg/dL — ABNORMAL HIGH (ref 70–99)
Potassium: 4.1 mEq/L (ref 3.5–5.1)
Sodium: 138 mEq/L (ref 135–145)

## 2022-04-19 LAB — HEPATIC FUNCTION PANEL
ALT: 9 U/L (ref 0–35)
AST: 12 U/L (ref 0–37)
Albumin: 4.1 g/dL (ref 3.5–5.2)
Alkaline Phosphatase: 72 U/L (ref 39–117)
Bilirubin, Direct: 0.1 mg/dL (ref 0.0–0.3)
Total Bilirubin: 0.5 mg/dL (ref 0.2–1.2)
Total Protein: 6.6 g/dL (ref 6.0–8.3)

## 2022-04-19 LAB — TSH: TSH: 2.66 u[IU]/mL (ref 0.35–5.50)

## 2022-04-19 LAB — HEMOGLOBIN A1C: Hgb A1c MFr Bld: 5.8 % (ref 4.6–6.5)

## 2022-04-19 MED ORDER — AMLODIPINE BESYLATE 5 MG PO TABS: ORAL_TABLET | Freq: Every day | ORAL | 1 refills | Status: DC

## 2022-04-19 MED ORDER — HYDROCHLOROTHIAZIDE 12.5 MG PO CAPS: ORAL_CAPSULE | Freq: Every day | ORAL | 1 refills | Status: DC

## 2022-04-19 MED ORDER — SERTRALINE HCL 50 MG PO TABS: 50.0000 mg | ORAL_TABLET | Freq: Every day | ORAL | 2 refills | Status: DC

## 2022-04-19 NOTE — Telephone Encounter (Signed)
Ok

## 2022-04-19 NOTE — Telephone Encounter (Signed)
Patient states she is having issues with [facility].  Patient states we called in refills for hydrochlorothiazide (MICROZIDE) 12.5 MG capsule, sertraline (ZOLOFT) 50 MG tablet, and amLODipine (NORVASC) 5 MG tablet to [facility] in [place].  Patient states she just found out that her insurance does not do well there, so she would like to have these prescriptions transferred to [facility] in [place] (phone number: [phone]).  Patient states she would like to have a 90-day supply for each.

## 2022-05-23 ENCOUNTER — Ambulatory Visit
Admission: RE | Admit: 2022-05-23 | Discharge: 2022-05-23 | Disposition: A | Discharge status: Home / Self Care | Payer: 59 | Source: Ambulatory Visit | Attending: Internal Medicine | Admitting: Internal Medicine

## 2022-05-23 DIAGNOSIS — Z1231 Encounter for screening mammogram for malignant neoplasm of breast: Secondary | ICD-10-CM | POA: Insufficient documentation

## 2022-08-05 DIAGNOSIS — J209 Acute bronchitis, unspecified: Secondary | ICD-10-CM | POA: Diagnosis not present

## 2022-08-05 DIAGNOSIS — R062 Wheezing: Secondary | ICD-10-CM | POA: Diagnosis not present

## 2022-08-05 DIAGNOSIS — Z6835 Body mass index (BMI) 35.0-35.9, adult: Secondary | ICD-10-CM | POA: Diagnosis not present

## 2022-08-05 DIAGNOSIS — Z03818 Encounter for observation for suspected exposure to other biological agents ruled out: Secondary | ICD-10-CM | POA: Diagnosis not present

## 2022-08-05 DIAGNOSIS — I10 Essential (primary) hypertension: Secondary | ICD-10-CM | POA: Diagnosis not present

## 2022-08-05 DIAGNOSIS — R051 Acute cough: Secondary | ICD-10-CM | POA: Diagnosis not present

## 2022-08-10 ENCOUNTER — Ambulatory Visit (INDEPENDENT_AMBULATORY_CARE_PROVIDER_SITE_OTHER): Payer: 59 | Admitting: Internal Medicine

## 2022-08-10 ENCOUNTER — Encounter: Payer: Self-pay | Admitting: Internal Medicine

## 2022-08-10 ENCOUNTER — Ambulatory Visit (INDEPENDENT_AMBULATORY_CARE_PROVIDER_SITE_OTHER): Payer: 59

## 2022-08-10 VITALS — BP 124/70 | HR 73 | Temp 98.2°F | Resp 15 | Ht 63.0 in | Wt 183.4 lb

## 2022-08-10 DIAGNOSIS — R059 Cough, unspecified: Secondary | ICD-10-CM | POA: Diagnosis not present

## 2022-08-10 DIAGNOSIS — I10 Essential (primary) hypertension: Secondary | ICD-10-CM

## 2022-08-10 DIAGNOSIS — E7800 Pure hypercholesterolemia, unspecified: Secondary | ICD-10-CM

## 2022-08-10 DIAGNOSIS — R69 Illness, unspecified: Secondary | ICD-10-CM | POA: Diagnosis not present

## 2022-08-10 DIAGNOSIS — R739 Hyperglycemia, unspecified: Secondary | ICD-10-CM

## 2022-08-10 DIAGNOSIS — Z86010 Personal history of colonic polyps: Secondary | ICD-10-CM

## 2022-08-10 DIAGNOSIS — R062 Wheezing: Secondary | ICD-10-CM | POA: Diagnosis not present

## 2022-08-10 DIAGNOSIS — J9811 Atelectasis: Secondary | ICD-10-CM | POA: Diagnosis not present

## 2022-08-10 DIAGNOSIS — F439 Reaction to severe stress, unspecified: Secondary | ICD-10-CM

## 2022-08-10 MED ORDER — PREDNISONE 10 MG PO TABS: ORAL_TABLET | ORAL | 0 refills | Status: DC

## 2022-08-10 MED ORDER — DOXYCYCLINE HYCLATE 100 MG PO TABS: 100.0000 mg | ORAL_TABLET | Freq: Two times a day (BID) | ORAL | 0 refills | Status: DC

## 2022-08-10 MED ORDER — AMLODIPINE BESYLATE 5 MG PO TABS: ORAL_TABLET | Freq: Every day | ORAL | 1 refills | Status: DC

## 2022-08-10 MED ORDER — HYDROCHLOROTHIAZIDE 12.5 MG PO CAPS: ORAL_CAPSULE | Freq: Every day | ORAL | 1 refills | Status: DC

## 2022-08-10 NOTE — Progress Notes (Signed)
Subjective:    Patient ID: Ashley Waller, female    DOB: [birth year], 62 y.o.   MRN: [id number]  Patient here for No chief complaint on file.   HPI Here to follow up regarding hypercholesterolemia, hypertension and increased stress.  Seen 08/05/22 - for cough and congestion. Diagnosed with acute bronchitis.  Treated with prednisone, zpak, tessalon perles and albuterol inhaler.  Still with persistent cough, congestion and wheezing.  No fever.  Some nasal congestion no sob.  Denies chest pain.  No nausea or vomiting.  Some diarrhea.  Retired in October.  Stress better.     Past Medical History:  Diagnosis Date   Abnormal mammogram 10/15/2012   Adult BMI 35.0-35.9 kg/sq m 01/31/2017   Arthritis    Asthma    Chest pain 03/13/2016   Health care maintenance 11/30/2014   Mammogram 12/22/15 - Birads 0.  Recommended f/u right breat mammogram.  Follow up right breast mammogram 01/11/16 - Birads I.  Recommended f/u mammogram in one year.      Hematuria    Hemorrhoid    History of colonic polyps 02/27/2015   Hypercholesterolemia 07/16/2012   Hypertension    Menopausal syndrome 07/22/2012   Plantar fasciitis of left foot 07/15/2013   Stress 05/17/2014   Past Surgical History:  Procedure Laterality Date   COLONOSCOPY  2013   EYELID LACERATION REPAIR  2006   TUBAL LIGATION  1995   Family History  Problem Relation Age of Onset   Hyperlipidemia Mother    Diabetes Mother    Hypertension Mother    Hyperlipidemia Father    Diabetes Father    Hypertension Father    Mental illness Maternal Grandmother    Mental illness Maternal Grandfather    Mental illness Paternal Grandmother    Mental illness Paternal Grandfather    Breast cancer Neg Hx    Social History   Socioeconomic History   Marital status: Married    Spouse name: Not on file   Number of children: Not on file   Years of education: Not on file   Highest education level: Not on file  Occupational History   Not on file  Tobacco Use    Smoking status: Never   Smokeless tobacco: Never  Substance and Sexual Activity   Alcohol use: Yes    Alcohol/week: 0.0 - 1.0 standard drinks of alcohol    Comment: ocassionally   Drug use: No   Sexual activity: Not on file  Other Topics Concern   Not on file  Social History Narrative   Not on file   Social Determinants of Health   Financial Resource Strain: Not on file  Food Insecurity: Not on file  Transportation Needs: Not on file  Physical Activity: Not on file  Stress: Not on file  Social Connections: Not on file     Review of Systems     Objective:     LMP 07/16/2009  Wt Readings from Last 3 Encounters:  04/05/22 197 lb 12.8 oz (89.7 kg)  10/13/21 191 lb (86.6 kg)  06/08/21 191 lb 12.8 oz (87 kg)    Physical Exam   Outpatient Encounter Medications as of 08/10/2022  Medication Sig   acetaminophen (TYLENOL) 325 MG tablet Take 650 mg by mouth every 6 (six) hours as needed for pain.   albuterol (VENTOLIN HFA) 108 (90 Base) MCG/ACT inhaler Inhale 2 puffs into the lungs every 6 (six) hours as needed for wheezing or shortness of breath.   amLODipine (NORVASC)  5 MG tablet TAKE 1 TABLET BY MOUTH DAILY.   Cetirizine HCl (ZYRTEC PO) Take by mouth.   cyclopentolate (CYCLODRYL,CYCLOGYL) 1 % ophthalmic solution Instill one drop into the left eye three times daily   fluticasone (FLONASE) 50 MCG/ACT nasal spray Place 2 sprays into both nostrils daily. Hold until pt request   hydrochlorothiazide (MICROZIDE) 12.5 MG capsule TAKE 1 CAPSULE BY MOUTH DAILY.   LORazepam (ATIVAN) 0.5 MG tablet Take 1/2 tablet q day prn   sertraline (ZOLOFT) 50 MG tablet Take 1 tablet (50 mg total) by mouth daily.   No facility-administered encounter medications on file as of 08/10/2022.     Lab Results  Component Value Date   WBC 5.4 04/19/2022   HGB 13.3 04/19/2022   HCT 38.7 04/19/2022   PLT 309.0 04/19/2022   GLUCOSE 112 (H) 04/19/2022   CHOL 217 (H) 04/19/2022   TRIG 232.0 (H)  04/19/2022   HDL 49.20 04/19/2022   LDLDIRECT 185.0 04/19/2022   LDLCALC 129 (H) 06/30/2021   ALT 9 04/19/2022   AST 12 04/19/2022   NA 138 04/19/2022   K 4.1 04/19/2022   CL 100 04/19/2022   CREATININE 0.63 04/19/2022   BUN 12 04/19/2022   CO2 31 04/19/2022   TSH 2.66 04/19/2022   HGBA1C 5.8 04/19/2022    MM 3D SCREEN BREAST BILATERAL  Result Date: 05/24/2022 CLINICAL DATA:  Screening. EXAM: DIGITAL SCREENING BILATERAL MAMMOGRAM WITH TOMOSYNTHESIS AND CAD TECHNIQUE: Bilateral screening digital craniocaudal and mediolateral oblique mammograms were obtained. Bilateral screening digital breast tomosynthesis was performed. The images were evaluated with computer-aided detection. COMPARISON:  Previous exam(s). ACR Breast Density Category b: There are scattered areas of fibroglandular density. FINDINGS: There are no findings suspicious for malignancy. IMPRESSION: No mammographic evidence of malignancy. A result letter of this screening mammogram will be mailed directly to the patient. RECOMMENDATION: Screening mammogram in one year. (Code:SM-B-01Y) BI-RADS CATEGORY  1: Negative. Electronically Signed   By: Ammie Ferrier M.D.   On: 05/24/2022 14:18       Assessment & Plan:  Hypercholesterolemia  Hyperglycemia  Primary hypertension     Einar Pheasant, MD

## 2022-08-11 ENCOUNTER — Encounter: Payer: Self-pay | Admitting: Internal Medicine

## 2022-08-11 NOTE — Assessment & Plan Note (Signed)
Persistent increased cough, congestion and wheezing.  Recently treated with zpak and prednisone.  Given persistent symptoms and exam findings, check cxr.  Doxycycline '100mg'$  bid.  Prednisone taper as directed.  Continue albuterol inhaler.  Saline nasal spray/steroid nasal spray as directed.  Follow.  Call with update.

## 2022-08-11 NOTE — Assessment & Plan Note (Signed)
Blood pressure as outlined.  On amlodipine and hctz.  Follow pressures.  Follow metabolic panel.

## 2022-08-11 NOTE — Assessment & Plan Note (Addendum)
Low carb diet and exercise.  Follow met b and a1c.  Discussed - prednisone can increased sugar.  Stay hydrated.  Follow.

## 2022-08-11 NOTE — Assessment & Plan Note (Signed)
Colonoscopy 05/2017 - hyperplastic polyp.  Recommended f/u in 5 years.  (Dr Alice Reichert).  Due f/u.

## 2022-08-11 NOTE — Assessment & Plan Note (Signed)
The 10-year ASCVD risk score (Arnett DK, et al., 2019) is: 6.3%   Values used to calculate the score:     Age: 63 years     Sex: Female     Is Non-Hispanic African American: No     Diabetic: No     Tobacco smoker: No     Systolic Blood Pressure: 037 mmHg     Is BP treated: Yes     HDL Cholesterol: 49.2 mg/dL     Total Cholesterol: 217 mg/dL  Low cholesterol diet and exercise.  Follow lipid panel.

## 2022-08-11 NOTE — Assessment & Plan Note (Signed)
Continue zoloft.  Overall appears to be doing well.  Has retired. Follow.

## 2022-08-12 ENCOUNTER — Telehealth: Payer: Self-pay | Admitting: Internal Medicine

## 2022-08-12 NOTE — Telephone Encounter (Signed)
SEE RESULT NOTE 

## 2022-08-12 NOTE — Telephone Encounter (Signed)
Pt returning call

## 2022-08-18 ENCOUNTER — Other Ambulatory Visit: Payer: Self-pay

## 2022-08-18 DIAGNOSIS — R059 Cough, unspecified: Secondary | ICD-10-CM

## 2022-09-29 ENCOUNTER — Other Ambulatory Visit: Payer: 59

## 2022-11-16 ENCOUNTER — Ambulatory Visit (INDEPENDENT_AMBULATORY_CARE_PROVIDER_SITE_OTHER): Payer: 59 | Admitting: Internal Medicine

## 2022-11-16 ENCOUNTER — Encounter: Payer: Self-pay | Admitting: Internal Medicine

## 2022-11-16 ENCOUNTER — Ambulatory Visit (INDEPENDENT_AMBULATORY_CARE_PROVIDER_SITE_OTHER): Payer: 59

## 2022-11-16 VITALS — BP 112/70 | HR 70 | Temp 98.2°F | Resp 16 | Ht 61.0 in | Wt 185.0 lb

## 2022-11-16 DIAGNOSIS — R9389 Abnormal findings on diagnostic imaging of other specified body structures: Secondary | ICD-10-CM

## 2022-11-16 DIAGNOSIS — F439 Reaction to severe stress, unspecified: Secondary | ICD-10-CM

## 2022-11-16 DIAGNOSIS — I10 Essential (primary) hypertension: Secondary | ICD-10-CM | POA: Diagnosis not present

## 2022-11-16 DIAGNOSIS — Z86010 Personal history of colonic polyps: Secondary | ICD-10-CM

## 2022-11-16 DIAGNOSIS — R739 Hyperglycemia, unspecified: Secondary | ICD-10-CM | POA: Diagnosis not present

## 2022-11-16 DIAGNOSIS — E7800 Pure hypercholesterolemia, unspecified: Secondary | ICD-10-CM | POA: Diagnosis not present

## 2022-11-16 DIAGNOSIS — R059 Cough, unspecified: Secondary | ICD-10-CM

## 2022-11-16 DIAGNOSIS — J209 Acute bronchitis, unspecified: Secondary | ICD-10-CM | POA: Diagnosis not present

## 2022-11-16 LAB — BASIC METABOLIC PANEL
BUN: 15 mg/dL (ref 6–23)
CO2: 27 mEq/L (ref 19–32)
Calcium: 9.5 mg/dL (ref 8.4–10.5)
Chloride: 101 mEq/L (ref 96–112)
Creatinine, Ser: 0.66 mg/dL (ref 0.40–1.20)
GFR: 92.83 mL/min (ref >60.00)
Glucose, Bld: 102 mg/dL — ABNORMAL HIGH (ref 70–99)
Potassium: 4.3 mEq/L (ref 3.5–5.1)
Sodium: 138 mEq/L (ref 135–145)

## 2022-11-16 LAB — HEPATIC FUNCTION PANEL
ALT: 9 U/L (ref 0–35)
AST: 13 U/L (ref 0–37)
Albumin: 4.3 g/dL (ref 3.5–5.2)
Alkaline Phosphatase: 71 U/L (ref 39–117)
Bilirubin, Direct: 0.1 mg/dL (ref 0.0–0.3)
Total Bilirubin: 0.4 mg/dL (ref 0.2–1.2)
Total Protein: 6.7 g/dL (ref 6.0–8.3)

## 2022-11-16 LAB — LIPID PANEL
Cholesterol: 227 mg/dL — ABNORMAL HIGH (ref 0–200)
HDL: 51.6 mg/dL (ref >39.00)
NonHDL: 175.26
Total CHOL/HDL Ratio: 4
Triglycerides: 305 mg/dL — ABNORMAL HIGH (ref 0.0–149.0)
VLDL: 61 mg/dL — ABNORMAL HIGH (ref 0.0–40.0)

## 2022-11-16 LAB — HEMOGLOBIN A1C: Hgb A1c MFr Bld: 5.5 % (ref 4.6–6.5)

## 2022-11-16 LAB — LDL CHOLESTEROL, DIRECT: Direct LDL: 140 mg/dL

## 2022-11-16 MED ORDER — AMLODIPINE BESYLATE 5 MG PO TABS: ORAL_TABLET | Freq: Every day | ORAL | 1 refills | Status: DC

## 2022-11-16 MED ORDER — HYDROCHLOROTHIAZIDE 12.5 MG PO CAPS: ORAL_CAPSULE | Freq: Every day | ORAL | 1 refills | Status: DC

## 2022-11-16 MED ORDER — SERTRALINE HCL 50 MG PO TABS: 50.0000 mg | ORAL_TABLET | Freq: Every day | ORAL | 2 refills | Status: DC

## 2022-11-16 NOTE — Progress Notes (Signed)
Subjective:    Patient ID: Ashley Waller, female    DOB: [birth year], 62 y.o.   MRN: [id number]  Patient here for  Chief Complaint  Patient presents with   Medical Management of Chronic Issues    HPI Here to follow up regarding hypercholesterolemia, hypertension and increased stress.  Retired October.  Doing well.  Trying to stay active.  Has not been watching her diet as well.  No formal exercise.  No chest pain or sob reported.  No abdominal pain or bowel change reported.     Past Medical History:  Diagnosis Date   Abnormal mammogram 10/15/2012   Adult BMI 35.0-35.9 kg/sq m 01/31/2017   Arthritis    Asthma    Chest pain 03/13/2016   Health care maintenance 11/30/2014   Mammogram 12/22/15 - Birads 0.  Recommended f/u right breat mammogram.  Follow up right breast mammogram 01/11/16 - Birads I.  Recommended f/u mammogram in one year.      Hematuria    Hemorrhoid    History of colonic polyps 02/27/2015   Hypercholesterolemia 07/16/2012   Hypertension    Menopausal syndrome 07/22/2012   Plantar fasciitis of left foot 07/15/2013   Stress 05/17/2014   Past Surgical History:  Procedure Laterality Date   COLONOSCOPY  2013   EYELID LACERATION REPAIR  2006   TUBAL LIGATION  1995   Family History  Problem Relation Age of Onset   Hyperlipidemia Mother    Diabetes Mother    Hypertension Mother    Hyperlipidemia Father    Diabetes Father    Hypertension Father    Mental illness Maternal Grandmother    Mental illness Maternal Grandfather    Mental illness Paternal Grandmother    Mental illness Paternal Grandfather    Breast cancer Neg Hx    Social History   Socioeconomic History   Marital status: Married    Spouse name: Not on file   Number of children: Not on file   Years of education: Not on file   Highest education level: Not on file  Occupational History   Not on file  Tobacco Use   Smoking status: Never   Smokeless tobacco: Never  Substance and Sexual Activity    Alcohol use: Yes    Alcohol/week: 0.0 - 1.0 standard drinks of alcohol    Comment: ocassionally   Drug use: No   Sexual activity: Not on file  Other Topics Concern   Not on file  Social History Narrative   Not on file   Social Determinants of Health   Financial Resource Strain: Not on file  Food Insecurity: Not on file  Transportation Needs: Not on file  Physical Activity: Not on file  Stress: Not on file  Social Connections: Not on file     Review of Systems  Constitutional:  Negative for appetite change and unexpected weight change.  HENT:  Negative for congestion and sinus pressure.   Respiratory:  Negative for cough, chest tightness and shortness of breath.   Cardiovascular:  Negative for chest pain, palpitations and leg swelling.  Gastrointestinal:  Negative for abdominal pain, diarrhea, nausea and vomiting.  Genitourinary:  Negative for difficulty urinating and dysuria.  Musculoskeletal:  Negative for joint swelling and myalgias.  Skin:  Negative for color change and rash.  Neurological:  Negative for dizziness and headaches.  Psychiatric/Behavioral:  Negative for agitation and dysphoric mood.        Objective:     BP 112/70   Pulse  70   Temp 98.2 F (36.8 C)   Resp 16   Ht  (1.549 m)   Wt 185 lb (83.9 kg)   LMP 07/16/2009   SpO2 99%   BMI 34.96 kg/m  Wt Readings from Last 3 Encounters:  11/16/22 185 lb (83.9 kg)  08/10/22 183 lb 6.4 oz (83.2 kg)  04/05/22 197 lb 12.8 oz (89.7 kg)    Physical Exam Vitals reviewed.  Constitutional:      General: She is not in acute distress.    Appearance: Normal appearance.  HENT:     Head: Normocephalic and atraumatic.     Right Ear: External ear normal.     Left Ear: External ear normal.  Eyes:     General: No scleral icterus.       Right eye: No discharge.        Left eye: No discharge.     Conjunctiva/sclera: Conjunctivae normal.  Neck:     Thyroid: No thyromegaly.  Cardiovascular:     Rate and  Rhythm: Normal rate and regular rhythm.  Pulmonary:     Effort: No respiratory distress.     Breath sounds: Normal breath sounds. No wheezing.  Abdominal:     General: Bowel sounds are normal.     Palpations: Abdomen is soft.     Tenderness: There is no abdominal tenderness.  Musculoskeletal:        General: No swelling or tenderness.     Cervical back: Neck supple. No tenderness.  Lymphadenopathy:     Cervical: No cervical adenopathy.  Skin:    Findings: No erythema or rash.  Neurological:     Mental Status: She is alert.  Psychiatric:        Mood and Affect: Mood normal.        Behavior: Behavior normal.      Outpatient Encounter Medications as of 11/16/2022  Medication Sig   acetaminophen (TYLENOL) 325 MG tablet Take 650 mg by mouth every 6 (six) hours as needed for pain.   albuterol (VENTOLIN HFA) 108 (90 Base) MCG/ACT inhaler Inhale 2 puffs into the lungs every 6 (six) hours as needed for wheezing or shortness of breath.   amLODipine (NORVASC) 5 MG tablet TAKE 1 TABLET BY MOUTH DAILY.   Cetirizine HCl (ZYRTEC PO) Take by mouth.   fluticasone (FLONASE) 50 MCG/ACT nasal spray Place 2 sprays into both nostrils daily. Hold until pt request   hydrochlorothiazide (MICROZIDE) 12.5 MG capsule TAKE 1 CAPSULE BY MOUTH DAILY.   sertraline (ZOLOFT) 50 MG tablet Take 1 tablet (50 mg total) by mouth daily.   [DISCONTINUED] amLODipine (NORVASC) 5 MG tablet TAKE 1 TABLET BY MOUTH DAILY.   [DISCONTINUED] cyclopentolate (CYCLODRYL,CYCLOGYL) 1 % ophthalmic solution Instill one drop into the left eye three times daily   [DISCONTINUED] doxycycline (VIBRA-TABS) 100 MG tablet Take 1 tablet (100 mg total) by mouth 2 (two) times daily.   [DISCONTINUED] hydrochlorothiazide (MICROZIDE) 12.5 MG capsule TAKE 1 CAPSULE BY MOUTH DAILY.   [DISCONTINUED] predniSONE (DELTASONE) 10 MG tablet Take 6 tablets x 1 day and then decrease by 1/2 tablet per day until down to zero mg.   [DISCONTINUED] sertraline  (ZOLOFT) 50 MG tablet Take 1 tablet (50 mg total) by mouth daily.   No facility-administered encounter medications on file as of 11/16/2022.     Lab Results  Component Value Date   WBC 5.4 04/19/2022   HGB 13.3 04/19/2022   HCT 38.7 04/19/2022   PLT 309.0 04/19/2022  GLUCOSE 102 (H) 11/16/2022   CHOL 227 (H) 11/16/2022   TRIG 305.0 (H) 11/16/2022   HDL 51.60 11/16/2022   LDLDIRECT 140.0 11/16/2022   LDLCALC 129 (H) 06/30/2021   ALT 9 11/16/2022   AST 13 11/16/2022   NA 138 11/16/2022   K 4.3 11/16/2022   CL 101 11/16/2022   CREATININE 0.66 11/16/2022   BUN 15 11/16/2022   CO2 27 11/16/2022   TSH 2.66 04/19/2022   HGBA1C 5.5 11/16/2022    MM 3D SCREEN BREAST BILATERAL  Result Date: 05/24/2022 CLINICAL DATA:  Screening. EXAM: DIGITAL SCREENING BILATERAL MAMMOGRAM WITH TOMOSYNTHESIS AND CAD TECHNIQUE: Bilateral screening digital craniocaudal and mediolateral oblique mammograms were obtained. Bilateral screening digital breast tomosynthesis was performed. The images were evaluated with computer-aided detection. COMPARISON:  Previous exam(s). ACR Breast Density Category b: There are scattered areas of fibroglandular density. FINDINGS: There are no findings suspicious for malignancy. IMPRESSION: No mammographic evidence of malignancy. A result letter of this screening mammogram will be mailed directly to the patient. RECOMMENDATION: Screening mammogram in one year. (Code:SM-B-01Y) BI-RADS CATEGORY  1: Negative. Electronically Signed   By: Frederico Hamman M.D.   On: 05/24/2022 14:18       Assessment & Plan:  Stress Assessment & Plan: Continue zoloft.  Overall appears to be doing well.  Has retired. Follow.    Hypercholesterolemia Assessment & Plan: The 10-year ASCVD risk score (Arnett DK, et al., 2019) is: 5.7%   Values used to calculate the score:     Age: 42 years     Sex: Female     Is Non-Hispanic African American: No     Diabetic: No     Tobacco smoker: No      Systolic Blood Pressure: 112 mmHg     Is BP treated: Yes     HDL Cholesterol: 51.6 mg/dL     Total Cholesterol: 227 mg/dL  Low cholesterol diet and exercise.  Follow lipid panel.   Orders: -     Lipid panel -     Hepatic function panel  Hyperglycemia Assessment & Plan: Low carb diet and exercise.  Follow met b and a1c.    Orders: -     Hemoglobin A1c  Primary hypertension Assessment & Plan: Blood pressure as outlined.  On amlodipine and hctz.  Follow pressures.  Follow metabolic panel.   Orders: -     Basic metabolic panel  History of colonic polyps Assessment & Plan: Colonoscopy 05/2017 - hyperplastic polyp.  Recommended f/u in 5 years.  (Dr Norma Fredrickson).  Due f/u.     Other orders -     amLODIPine Besylate; TAKE 1 TABLET BY MOUTH DAILY.  Dispense: 90 tablet; Refill: 1 -     hydroCHLOROthiazide; TAKE 1 CAPSULE BY MOUTH DAILY.  Dispense: 90 capsule; Refill: 1 -     Sertraline HCl; Take 1 tablet (50 mg total) by mouth daily.  Dispense: 90 tablet; Refill: 2 -     LDL cholesterol, direct     Dale [place], MD

## 2022-11-20 ENCOUNTER — Encounter: Payer: Self-pay | Admitting: Internal Medicine

## 2022-11-20 DIAGNOSIS — R9389 Abnormal findings on diagnostic imaging of other specified body structures: Secondary | ICD-10-CM | POA: Insufficient documentation

## 2022-11-20 NOTE — Assessment & Plan Note (Signed)
Colonoscopy 05/2017 - hyperplastic polyp.  Recommended f/u in 5 years.  (Dr [place]).  Due f/u.   

## 2022-11-20 NOTE — Assessment & Plan Note (Signed)
Low carb diet and exercise.  Follow met b and a1c.   

## 2022-11-20 NOTE — Assessment & Plan Note (Signed)
Abnormal cxr - previous.  Recheck today to confirm clear.  Currently no cough or congestion.

## 2022-11-20 NOTE — Assessment & Plan Note (Signed)
Continue zoloft.  Overall appears to be doing well.  Has retired. Follow.  

## 2022-11-20 NOTE — Assessment & Plan Note (Signed)
Blood pressure as outlined.  On amlodipine and hctz.  Follow pressures.  Follow metabolic panel.  ?

## 2022-11-20 NOTE — Assessment & Plan Note (Signed)
The 10-year ASCVD risk score (Arnett DK, et al., 2019) is: 5.7%   Values used to calculate the score:     Age: 62 years     Sex: Female     Is Non-Hispanic African American: No     Diabetic: No     Tobacco smoker: No     Systolic Blood Pressure: 112 mmHg     Is BP treated: Yes     HDL Cholesterol: 51.6 mg/dL     Total Cholesterol: 227 mg/dL  Low cholesterol diet and exercise.  Follow lipid panel.

## 2023-05-18 ENCOUNTER — Ambulatory Visit (INDEPENDENT_AMBULATORY_CARE_PROVIDER_SITE_OTHER): Payer: 59 | Admitting: Internal Medicine

## 2023-05-18 ENCOUNTER — Encounter: Payer: Self-pay | Admitting: Internal Medicine

## 2023-05-18 VITALS — BP 108/72 | HR 80 | Temp 98.2°F | Resp 16 | Ht 61.0 in | Wt 176.0 lb

## 2023-05-18 DIAGNOSIS — F439 Reaction to severe stress, unspecified: Secondary | ICD-10-CM | POA: Diagnosis not present

## 2023-05-18 DIAGNOSIS — E7800 Pure hypercholesterolemia, unspecified: Secondary | ICD-10-CM

## 2023-05-18 DIAGNOSIS — I10 Essential (primary) hypertension: Secondary | ICD-10-CM | POA: Diagnosis not present

## 2023-05-18 DIAGNOSIS — R059 Cough, unspecified: Secondary | ICD-10-CM | POA: Diagnosis not present

## 2023-05-18 DIAGNOSIS — Z0000 Encounter for general adult medical examination without abnormal findings: Secondary | ICD-10-CM

## 2023-05-18 DIAGNOSIS — R739 Hyperglycemia, unspecified: Secondary | ICD-10-CM

## 2023-05-18 MED ORDER — DOXYCYCLINE HYCLATE 100 MG PO TABS: 100.0000 mg | ORAL_TABLET | Freq: Two times a day (BID) | ORAL | 0 refills | Status: DC

## 2023-05-18 MED ORDER — PREDNISONE 10 MG PO TABS: ORAL_TABLET | ORAL | 0 refills | Status: DC

## 2023-05-18 NOTE — Assessment & Plan Note (Signed)
Physical today 05/18/23.  Mammogram 05/24/22  - Birads I.  Colonoscopy 05/2017.  Recommended f/u in 5 years.  PAP 12/29/20 - negative with negative HPV.

## 2023-05-18 NOTE — Progress Notes (Signed)
Subjective:    Patient ID: Ashley Waller, female    DOB: October 05, 1960, 62 y.o.   MRN: [id number]  Patient here for  Chief Complaint  Patient presents with   Annual Exam    HPI Here for a physical exam. Retired 05/2022. Continues on zoloft. Doing well during with her retirement.  Tries to stay active.  No chest pain reported.  Does report that she went to the beach first weekend of October.  Her friend was sick.  Shortly after, she developed a sore throat.  Increased congestion - nasal congestion and throat/chest congestion.  Increased cough.  Some wheezing. No chest pain or increased sob.  Some diarrhea.  No abdominal pain.     Past Medical History:  Diagnosis Date   Abnormal mammogram 10/15/2012   Adult BMI 35.0-35.9 kg/sq m 01/31/2017   Arthritis    Asthma    Chest pain 03/13/2016   Health care maintenance 11/30/2014   Mammogram 12/22/15 - Birads 0.  Recommended f/u right breat mammogram.  Follow up right breast mammogram 01/11/16 - Birads I.  Recommended f/u mammogram in one year.      Hematuria    Hemorrhoid    History of colonic polyps 02/27/2015   Hypercholesterolemia 07/16/2012   Hypertension    Menopausal syndrome 07/22/2012   Plantar fasciitis of left foot 07/15/2013   Stress 05/17/2014   Past Surgical History:  Procedure Laterality Date   COLONOSCOPY  2013   EYELID LACERATION REPAIR  2006   TUBAL LIGATION  1995   Family History  Problem Relation Age of Onset   Hyperlipidemia Mother    Diabetes Mother    Hypertension Mother    Hyperlipidemia Father    Diabetes Father    Hypertension Father    Mental illness Maternal Grandmother    Mental illness Maternal Grandfather    Mental illness Paternal Grandmother    Mental illness Paternal Grandfather    Breast cancer Neg Hx    Social History   Socioeconomic History   Marital status: Married    Spouse name: Not on file   Number of children: Not on file   Years of education: Not on file   Highest education level: Not  on file  Occupational History   Not on file  Tobacco Use   Smoking status: Never   Smokeless tobacco: Never  Substance and Sexual Activity   Alcohol use: Yes    Alcohol/week: 0.0 - 1.0 standard drinks of alcohol    Comment: ocassionally   Drug use: No   Sexual activity: Not on file  Other Topics Concern   Not on file  Social History Narrative   Not on file   Social Determinants of Health   Financial Resource Strain: Not on file  Food Insecurity: Not on file  Transportation Needs: Not on file  Physical Activity: Not on file  Stress: Not on file  Social Connections: Not on file     Review of Systems  Constitutional:  Negative for appetite change and unexpected weight change.  HENT:  Positive for congestion. Negative for sinus pressure and sore throat.   Eyes:  Negative for pain and visual disturbance.  Respiratory:  Positive for cough. Negative for chest tightness and shortness of breath.   Cardiovascular:  Negative for chest pain, palpitations and leg swelling.  Gastrointestinal:  Negative for abdominal pain, diarrhea, nausea and vomiting.  Genitourinary:  Negative for difficulty urinating and dysuria.  Musculoskeletal:  Negative for joint swelling and myalgias.  Skin:  Negative for color change and rash.  Neurological:  Negative for dizziness and headaches.  Hematological:  Negative for adenopathy. Does not bruise/bleed easily.  Psychiatric/Behavioral:  Negative for agitation and dysphoric mood.        Objective:     BP 108/72   Pulse 80   Temp 98.2 F (36.8 C)   Resp 16   Ht 5\' 1"  (1.549 m)   Wt 176 lb (79.8 kg)   LMP 07/16/2009   SpO2 98%   BMI 33.25 kg/m  Wt Readings from Last 3 Encounters:  05/18/23 176 lb (79.8 kg)  11/16/22 185 lb (83.9 kg)  08/10/22 183 lb 6.4 oz (83.2 kg)    Physical Exam Vitals reviewed.  Constitutional:      General: She is not in acute distress.    Appearance: Normal appearance. She is well-developed.  HENT:     Head:  Normocephalic and atraumatic.     Right Ear: External ear normal.     Left Ear: External ear normal.  Eyes:     General: No scleral icterus.       Right eye: No discharge.        Left eye: No discharge.     Conjunctiva/sclera: Conjunctivae normal.  Neck:     Thyroid: No thyromegaly.  Cardiovascular:     Rate and Rhythm: Normal rate and regular rhythm.  Pulmonary:     Effort: No tachypnea, accessory muscle usage or respiratory distress.     Breath sounds: Normal breath sounds. No decreased breath sounds or wheezing.  Chest:  Breasts:    Right: No inverted nipple, mass, nipple discharge or tenderness (no axillary adenopathy).     Left: No inverted nipple, mass, nipple discharge or tenderness (no axilarry adenopathy).  Abdominal:     General: Bowel sounds are normal.     Palpations: Abdomen is soft.     Tenderness: There is no abdominal tenderness.  Musculoskeletal:        General: No swelling or tenderness.     Cervical back: Neck supple.  Lymphadenopathy:     Cervical: No cervical adenopathy.  Skin:    Findings: No erythema or rash.  Neurological:     Mental Status: She is alert and oriented to person, place, and time.  Psychiatric:        Mood and Affect: Mood normal.        Behavior: Behavior normal.      Outpatient Encounter Medications as of 05/18/2023  Medication Sig   doxycycline (VIBRA-TABS) 100 MG tablet Take 1 tablet (100 mg total) by mouth 2 (two) times daily.   predniSONE (DELTASONE) 10 MG tablet Take 6 tablets x 1 day and then decrease by 1/2 tablet per day until down to zero mg.   acetaminophen (TYLENOL) 325 MG tablet Take 650 mg by mouth every 6 (six) hours as needed for pain.   albuterol (VENTOLIN HFA) 108 (90 Base) MCG/ACT inhaler Inhale 2 puffs into the lungs every 6 (six) hours as needed for wheezing or shortness of breath.   amLODipine (NORVASC) 5 MG tablet TAKE 1 TABLET BY MOUTH DAILY.   Cetirizine HCl (ZYRTEC PO) Take by mouth.   fluticasone  (FLONASE) 50 MCG/ACT nasal spray Place 2 sprays into both nostrils daily. Hold until pt request   hydrochlorothiazide (MICROZIDE) 12.5 MG capsule TAKE 1 CAPSULE BY MOUTH DAILY.   sertraline (ZOLOFT) 50 MG tablet Take 1 tablet (50 mg total) by mouth daily.   No facility-administered encounter medications  on file as of 05/18/2023.     Lab Results  Component Value Date   WBC 5.4 04/19/2022   HGB 13.3 04/19/2022   HCT 38.7 04/19/2022   PLT 309.0 04/19/2022   GLUCOSE 102 (H) 11/16/2022   CHOL 227 (H) 11/16/2022   TRIG 305.0 (H) 11/16/2022   HDL 51.60 11/16/2022   LDLDIRECT 140.0 11/16/2022   LDLCALC 129 (H) 06/30/2021   ALT 9 11/16/2022   AST 13 11/16/2022   NA 138 11/16/2022   K 4.3 11/16/2022   CL 101 11/16/2022   CREATININE 0.66 11/16/2022   BUN 15 11/16/2022   CO2 27 11/16/2022   TSH 2.66 04/19/2022   HGBA1C 5.5 11/16/2022    MM 3D SCREEN BREAST BILATERAL  Result Date: 05/24/2022 CLINICAL DATA:  Screening. EXAM: DIGITAL SCREENING BILATERAL MAMMOGRAM WITH TOMOSYNTHESIS AND CAD TECHNIQUE: Bilateral screening digital craniocaudal and mediolateral oblique mammograms were obtained. Bilateral screening digital breast tomosynthesis was performed. The images were evaluated with computer-aided detection. COMPARISON:  Previous exam(s). ACR Breast Density Category b: There are scattered areas of fibroglandular density. FINDINGS: There are no findings suspicious for malignancy. IMPRESSION: No mammographic evidence of malignancy. A result letter of this screening mammogram will be mailed directly to the patient. RECOMMENDATION: Screening mammogram in one year. (Code:SM-B-01Y) BI-RADS CATEGORY  1: Negative. Electronically Signed   By: Frederico Hamman M.D.   On: 05/24/2022 14:18       Assessment & Plan:  Routine general medical examination at a health care facility  Health care maintenance Assessment & Plan: Physical today 05/18/23.  Mammogram 05/24/22  - Birads I.  Colonoscopy 05/2017.   Recommended f/u in 5 years.  PAP 12/29/20 - negative with negative HPV.    Hypercholesterolemia Assessment & Plan: The 10-year ASCVD risk score (Arnett DK, et al., 2019) is: 5.3%   Values used to calculate the score:     Age: 56 years     Sex: Female     Is Non-Hispanic African American: No     Diabetic: No     Tobacco smoker: No     Systolic Blood Pressure: 108 mmHg     Is BP treated: Yes     HDL Cholesterol: 51.6 mg/dL     Total Cholesterol: 227 mg/dL  Low cholesterol diet and exercise.  Follow lipid panel.   Orders: -     CBC with Differential/Platelet; Future -     Hepatic function panel; Future -     TSH; Future -     Lipid panel; Future  Primary hypertension Assessment & Plan: Blood pressure as outlined.  On amlodipine and hctz.  Follow pressures.  Follow metabolic panel.   Orders: -     Basic metabolic panel; Future  Hyperglycemia Assessment & Plan: Low carb diet and exercise.  Follow met b and a1c.    Orders: -     Hemoglobin A1c; Future  Stress Assessment & Plan: Continue zoloft.  Overall appears to be doing well.  Has retired. Follow.    Cough, unspecified type Assessment & Plan: Recent cough and congestion.  Improved.  Continue flonase and guaf.  Follow.  Notify if persistent.    Other orders -     Doxycycline Hyclate; Take 1 tablet (100 mg total) by mouth 2 (two) times daily.  Dispense: 20 tablet; Refill: 0 -     predniSONE; Take 6 tablets x 1 day and then decrease by 1/2 tablet per day until down to zero mg.  Dispense: 39 tablet; Refill:  0     Dale Logan, MD

## 2023-05-21 ENCOUNTER — Encounter: Payer: Self-pay | Admitting: Internal Medicine

## 2023-05-21 NOTE — Assessment & Plan Note (Signed)
Blood pressure as outlined.  On amlodipine and hctz.  Follow pressures.  Follow metabolic panel.  

## 2023-05-21 NOTE — Assessment & Plan Note (Signed)
Recent cough and congestion.  Improved.  Continue flonase and guaf.  Follow.  Notify if persistent.

## 2023-05-21 NOTE — Assessment & Plan Note (Signed)
Low carb diet and exercise.  Follow met b and a1c.  

## 2023-05-21 NOTE — Assessment & Plan Note (Signed)
The 10-year ASCVD risk score (Arnett DK, et al., 2019) is: 5.3%   Values used to calculate the score:     Age: 62 years     Sex: Female     Is Non-Hispanic African American: No     Diabetic: No     Tobacco smoker: No     Systolic Blood Pressure: 108 mmHg     Is BP treated: Yes     HDL Cholesterol: 51.6 mg/dL     Total Cholesterol: 227 mg/dL  Low cholesterol diet and exercise.  Follow lipid panel.

## 2023-05-21 NOTE — Assessment & Plan Note (Signed)
Continue zoloft.  Overall appears to be doing well.  Has retired. Follow.

## 2023-06-07 ENCOUNTER — Other Ambulatory Visit (INDEPENDENT_AMBULATORY_CARE_PROVIDER_SITE_OTHER): Payer: 59

## 2023-06-07 ENCOUNTER — Other Ambulatory Visit: Payer: Self-pay

## 2023-06-07 DIAGNOSIS — R739 Hyperglycemia, unspecified: Secondary | ICD-10-CM

## 2023-06-07 DIAGNOSIS — I10 Essential (primary) hypertension: Secondary | ICD-10-CM | POA: Diagnosis not present

## 2023-06-07 DIAGNOSIS — E7800 Pure hypercholesterolemia, unspecified: Secondary | ICD-10-CM | POA: Diagnosis not present

## 2023-06-07 LAB — LIPID PANEL
Cholesterol: 210 mg/dL — ABNORMAL HIGH (ref 0–200)
HDL: 56.1 mg/dL (ref >39.00)
LDL Cholesterol: 115 mg/dL — ABNORMAL HIGH (ref 0–99)
NonHDL: 154.15
Total CHOL/HDL Ratio: 4
Triglycerides: 196 mg/dL — ABNORMAL HIGH (ref 0.0–149.0)
VLDL: 39.2 mg/dL (ref 0.0–40.0)

## 2023-06-07 LAB — CBC WITH DIFFERENTIAL/PLATELET
Basophils Absolute: 0 10*3/uL (ref 0.0–0.1)
Basophils Relative: 0.6 % (ref 0.0–3.0)
Eosinophils Absolute: 0.1 10*3/uL (ref 0.0–0.7)
Eosinophils Relative: 1.6 % (ref 0.0–5.0)
HCT: 41.8 % (ref 36.0–46.0)
Hemoglobin: 13.8 g/dL (ref 12.0–15.0)
Lymphocytes Relative: 42.9 % (ref 12.0–46.0)
Lymphs Abs: 2.3 10*3/uL (ref 0.7–4.0)
MCHC: 32.9 g/dL (ref 30.0–36.0)
MCV: 89.3 fL (ref 78.0–100.0)
Monocytes Absolute: 0.4 10*3/uL (ref 0.1–1.0)
Monocytes Relative: 7.2 % (ref 3.0–12.0)
Neutro Abs: 2.5 10*3/uL (ref 1.4–7.7)
Neutrophils Relative %: 47.7 % (ref 43.0–77.0)
Platelets: 312 10*3/uL (ref 150.0–400.0)
RBC: 4.69 Mil/uL (ref 3.87–5.11)
RDW: 13.1 % (ref 11.5–15.5)
WBC: 5.3 10*3/uL (ref 4.0–10.5)

## 2023-06-07 LAB — TSH: TSH: 2.04 u[IU]/mL (ref 0.35–5.50)

## 2023-06-07 LAB — HEPATIC FUNCTION PANEL
ALT: 16 U/L (ref 0–35)
AST: 18 U/L (ref 0–37)
Albumin: 4.3 g/dL (ref 3.5–5.2)
Alkaline Phosphatase: 83 U/L (ref 39–117)
Bilirubin, Direct: 0.1 mg/dL (ref 0.0–0.3)
Total Bilirubin: 0.7 mg/dL (ref 0.2–1.2)
Total Protein: 7 g/dL (ref 6.0–8.3)

## 2023-06-07 LAB — BASIC METABOLIC PANEL
BUN: 11 mg/dL (ref 6–23)
CO2: 31 meq/L (ref 19–32)
Calcium: 9.7 mg/dL (ref 8.4–10.5)
Chloride: 99 meq/L (ref 96–112)
Creatinine, Ser: 0.65 mg/dL (ref 0.40–1.20)
GFR: 92.82 mL/min (ref >60.00)
Glucose, Bld: 107 mg/dL — ABNORMAL HIGH (ref 70–99)
Potassium: 3.9 meq/L (ref 3.5–5.1)
Sodium: 137 meq/L (ref 135–145)

## 2023-06-07 LAB — HEMOGLOBIN A1C: Hgb A1c MFr Bld: 5.9 % (ref 4.6–6.5)

## 2023-06-07 MED ORDER — FLULAVAL 0.5 ML IM SUSY
0.5000 mL | PREFILLED_SYRINGE | Freq: Once | INTRAMUSCULAR | 0 refills | Status: AC
  Filled 2023-06-07: qty 0.5, 1d supply, fill #0

## 2023-06-09 ENCOUNTER — Other Ambulatory Visit: Payer: Self-pay | Admitting: Internal Medicine

## 2023-06-09 DIAGNOSIS — Z1231 Encounter for screening mammogram for malignant neoplasm of breast: Secondary | ICD-10-CM

## 2023-06-27 ENCOUNTER — Ambulatory Visit
Admission: RE | Admit: 2023-06-27 | Discharge: 2023-06-27 | Disposition: A | Discharge status: Home / Self Care | Payer: 59 | Source: Ambulatory Visit | Attending: Internal Medicine | Admitting: Internal Medicine

## 2023-06-27 DIAGNOSIS — Z1231 Encounter for screening mammogram for malignant neoplasm of breast: Secondary | ICD-10-CM | POA: Insufficient documentation

## 2023-06-30 ENCOUNTER — Other Ambulatory Visit: Payer: Self-pay | Admitting: Internal Medicine

## 2023-06-30 DIAGNOSIS — R928 Other abnormal and inconclusive findings on diagnostic imaging of breast: Secondary | ICD-10-CM

## 2023-06-30 NOTE — Progress Notes (Signed)
Order placed for f/u right breast mammogram and ultrasound  

## 2023-07-05 ENCOUNTER — Ambulatory Visit
Admission: RE | Admit: 2023-07-05 | Discharge: 2023-07-05 | Disposition: A | Discharge status: Home / Self Care | Payer: 59 | Source: Ambulatory Visit | Attending: Internal Medicine | Admitting: Internal Medicine

## 2023-07-05 DIAGNOSIS — R92321 Mammographic fibroglandular density, right breast: Secondary | ICD-10-CM | POA: Diagnosis not present

## 2023-07-05 DIAGNOSIS — R928 Other abnormal and inconclusive findings on diagnostic imaging of breast: Secondary | ICD-10-CM | POA: Diagnosis not present

## 2023-07-05 DIAGNOSIS — N6312 Unspecified lump in the right breast, upper inner quadrant: Secondary | ICD-10-CM | POA: Diagnosis not present

## 2023-07-06 ENCOUNTER — Other Ambulatory Visit: Payer: Self-pay | Admitting: Internal Medicine

## 2023-07-06 DIAGNOSIS — N63 Unspecified lump in unspecified breast: Secondary | ICD-10-CM

## 2023-07-06 DIAGNOSIS — R928 Other abnormal and inconclusive findings on diagnostic imaging of breast: Secondary | ICD-10-CM

## 2023-07-12 ENCOUNTER — Ambulatory Visit
Admission: RE | Admit: 2023-07-12 | Discharge: 2023-07-12 | Disposition: A | Discharge status: Home / Self Care | Payer: 59 | Source: Ambulatory Visit | Attending: Internal Medicine | Admitting: Internal Medicine

## 2023-07-12 DIAGNOSIS — N63 Unspecified lump in unspecified breast: Secondary | ICD-10-CM

## 2023-07-12 DIAGNOSIS — N6312 Unspecified lump in the right breast, upper inner quadrant: Secondary | ICD-10-CM | POA: Diagnosis not present

## 2023-07-12 DIAGNOSIS — R928 Other abnormal and inconclusive findings on diagnostic imaging of breast: Secondary | ICD-10-CM | POA: Diagnosis not present

## 2023-07-12 HISTORY — PX: BREAST BIOPSY: SHX20

## 2023-07-12 MED ORDER — LIDOCAINE 1 % OPTIME INJ - NO CHARGE
5.0000 mL | Freq: Once | INTRAMUSCULAR | Status: DC
  Filled 2023-07-12: qty 6

## 2023-07-12 MED ORDER — LIDOCAINE-EPINEPHRINE (PF) 1 %-1:200000 IJ SOLN: 10.0000 mL | Freq: Once | INTRAMUSCULAR | Status: DC

## 2023-07-12 MED ORDER — LIDOCAINE 1 % OPTIME INJ - NO CHARGE
5.0000 mL | Freq: Once | INTRAMUSCULAR | Status: AC
  Administered 2023-07-12: 5 mL
  Filled 2023-07-12: qty 6

## 2023-07-12 MED ORDER — LIDOCAINE-EPINEPHRINE (PF) 1 %-1:200000 IJ SOLN
10.0000 mL | Freq: Once | INTRAMUSCULAR | Status: AC
  Administered 2023-07-12: 10 mL
  Filled 2023-07-12: qty 10

## 2023-07-13 LAB — SURGICAL PATHOLOGY

## 2023-09-20 ENCOUNTER — Other Ambulatory Visit: Payer: Self-pay

## 2023-09-20 ENCOUNTER — Encounter: Payer: Self-pay | Admitting: Internal Medicine

## 2023-09-20 ENCOUNTER — Telehealth (INDEPENDENT_AMBULATORY_CARE_PROVIDER_SITE_OTHER): Payer: HMO | Admitting: Internal Medicine

## 2023-09-20 VITALS — Ht 61.0 in | Wt 176.0 lb

## 2023-09-20 DIAGNOSIS — Z1211 Encounter for screening for malignant neoplasm of colon: Secondary | ICD-10-CM | POA: Diagnosis not present

## 2023-09-20 DIAGNOSIS — F439 Reaction to severe stress, unspecified: Secondary | ICD-10-CM | POA: Diagnosis not present

## 2023-09-20 DIAGNOSIS — I10 Essential (primary) hypertension: Secondary | ICD-10-CM

## 2023-09-20 DIAGNOSIS — E7800 Pure hypercholesterolemia, unspecified: Secondary | ICD-10-CM | POA: Diagnosis not present

## 2023-09-20 DIAGNOSIS — R739 Hyperglycemia, unspecified: Secondary | ICD-10-CM | POA: Diagnosis not present

## 2023-09-20 DIAGNOSIS — Z860100 Personal history of colon polyps, unspecified: Secondary | ICD-10-CM

## 2023-09-20 MED ORDER — SERTRALINE HCL 50 MG PO TABS
50.0000 mg | ORAL_TABLET | Freq: Every day | ORAL | 1 refills | Status: DC
  Filled 2023-09-20: qty 90, 90d supply, fill #0
  Filled 2024-05-22: qty 90, 90d supply, fill #1

## 2023-09-20 MED ORDER — FLUTICASONE PROPIONATE 50 MCG/ACT NA SUSP
2.0000 | Freq: Every day | NASAL | 1 refills | Status: AC
  Filled 2023-09-20: qty 48, 90d supply, fill #0

## 2023-09-20 MED ORDER — AMLODIPINE BESYLATE 5 MG PO TABS
5.0000 mg | ORAL_TABLET | Freq: Every day | ORAL | 1 refills | Status: DC
  Filled 2023-09-20 – 2023-11-15 (×2): qty 90, 90d supply, fill #0
  Filled 2024-02-08: qty 90, 90d supply, fill #1

## 2023-09-20 NOTE — Assessment & Plan Note (Signed)
 The 10-year ASCVD risk score (Arnett DK, et al., 2019) is: 5.4%   Values used to calculate the score:     Age: 63 years     Sex: Female     Is Non-Hispanic African American: No     Diabetic: No     Tobacco smoker: No     Systolic Blood Pressure: 108 mmHg     Is BP treated: Yes     HDL Cholesterol: 56.1 mg/dL     Total Cholesterol: 210 mg/dL  Low cholesterol diet and exercise.  Follow lipid panel.

## 2023-09-20 NOTE — Assessment & Plan Note (Signed)
 Colonoscopy 05/2017 - hyperplastic polyp.  Recommended f/u in 5 years.  (Dr Norma Fredrickson).  Due f/u. Refer for colonoscopy. Requested [facility] referral = GI.

## 2023-09-20 NOTE — Progress Notes (Signed)
 Patient ID: Ashley Waller, female   DOB: Nov 28, 1960, 63 y.o.   MRN: [id number]   Virtual Visit via video Note  I connected with Ashley Waller by a video enabled telemedicine application and verified that I am speaking with the correct person using two identifiers. Location patient: home Location provider: home Persons participating in the virtual visit: patient, provider  The limitations, risks, security and privacy concerns of performing an evaluation and management service by video and the availability of in person appointments have been discussed. It has also been discussed with the patient that there may be a patient responsible charge related to this service. The patient expressed understanding and agreed to proceed.   Reason for visit: follow up appt.   HPI: Follow up regarding hypertension and hyperglycemia. She has been doing relatively well. Stays active. No chest pain or sob reported. Blood pressure has been running low 100 range. Recent checks - 96/68 and 98/74.  She is taking hydrochlorothiazide and amlodipine. Discussed stopping the hydrochlorothiazide given recent pressure readings. Denies dizziness or light headedness.  Previous issues with her bowels. Started probiotic. Bowels better. Due colonoscopy. Request GI through [facility] with new insurance. Recent breast biopsy - ok. Recommended continuing annual screening.  ROS: See pertinent positives and negatives per HPI.  Past Medical History:  Diagnosis Date   Abnormal mammogram 10/15/2012   Adult BMI 35.0-35.9 kg/sq m 01/31/2017   Arthritis    Asthma    Chest pain 03/13/2016   Health care maintenance 11/30/2014   Mammogram 12/22/15 - Birads 0.  Recommended f/u right breat mammogram.  Follow up right breast mammogram 01/11/16 - Birads I.  Recommended f/u mammogram in one year.      Hematuria    Hemorrhoid    History of colonic polyps 02/27/2015   Hypercholesterolemia 07/16/2012   Hypertension    Menopausal syndrome 07/22/2012   Plantar  fasciitis of left foot 07/15/2013   Stress 05/17/2014    Past Surgical History:  Procedure Laterality Date   BREAST BIOPSY Right 07/12/2023   u/s bx 2 3cmfn, venus clip path pending   BREAST BIOPSY Right 07/12/2023   [place] RT BREAST BX W LOC DEV 1ST LESION IMG BX SPEC US GUIDE 07/12/2023 [facility]   COLONOSCOPY  08/02/2011   EYELID LACERATION REPAIR  08/01/2004   TUBAL LIGATION  08/01/1993    Family History  Problem Relation Age of Onset   Hyperlipidemia Mother    Diabetes Mother    Hypertension Mother    Hyperlipidemia Father    Diabetes Father    Hypertension Father    Mental illness Maternal Grandmother    Mental illness Maternal Grandfather    Mental illness Paternal Grandmother    Mental illness Paternal Grandfather    Breast cancer Neg Hx     SOCIAL HX: reviewed.    Current Outpatient Medications:    acetaminophen (TYLENOL) 325 MG tablet, Take 650 mg by mouth every 6 (six) hours as needed for pain., Disp: , Rfl:    albuterol (VENTOLIN HFA) 108 (90 Base) MCG/ACT inhaler, Inhale 2 puffs into the lungs every 6 (six) hours as needed for wheezing or shortness of breath., Disp: 18 g, Rfl: 1   amLODipine (NORVASC) 5 MG tablet, TAKE 1 TABLET BY MOUTH DAILY., Disp: 90 tablet, Rfl: 1   Cetirizine HCl (ZYRTEC PO), Take by mouth., Disp: , Rfl:    fluticasone (FLONASE) 50 MCG/ACT nasal spray, Place 2 sprays into both nostrils daily. Hold until pt request, Disp: 48 g, Rfl:  1   sertraline (ZOLOFT) 50 MG tablet, Take 1 tablet (50 mg total) by mouth daily., Disp: 90 tablet, Rfl: 1  EXAM:  VITALS per patient if applicable: 96/69, 98/74. Recent average - 104-111/78  GENERAL: alert, oriented, appears well and in no acute distress  HEENT: atraumatic, conjunttiva clear, no obvious abnormalities on inspection of external nose and ears  NECK: normal movements of the head and neck  LUNGS: on inspection no signs of respiratory distress, breathing rate appears normal, no obvious  gross SOB, gasping or wheezing  CV: no obvious cyanosis  PSYCH/NEURO: pleasant and cooperative, no obvious depression or anxiety, speech and thought processing grossly intact  ASSESSMENT AND PLAN:  Discussed the following assessment and plan:  Problem List Items Addressed This Visit     History of colonic polyps - Primary   Colonoscopy 05/2017 - hyperplastic polyp.  Recommended f/u in 5 years.  (Dr Norma Fredrickson).  Due f/u. Refer for colonoscopy. Requested [facility] referral = GI.        Relevant Orders   Ambulatory referral to Gastroenterology   Hypercholesterolemia   The 10-year ASCVD risk score (Arnett DK, et al., 2019) is: 5.4%   Values used to calculate the score:     Age: 89 years     Sex: Female     Is Non-Hispanic African American: No     Diabetic: No     Tobacco smoker: No     Systolic Blood Pressure: 108 mmHg     Is BP treated: Yes     HDL Cholesterol: 56.1 mg/dL     Total Cholesterol: 210 mg/dL  Low cholesterol diet and exercise.  Follow lipid panel.       Relevant Medications   amLODipine (NORVASC) 5 MG tablet   Hyperglycemia   Low carb diet and exercise. Follow met b and A1c.       Hypertension   Blood pressure as outlined. Given low blood pressure, will hold hydrochlorothiazide. Follow pressures. Follow metabolic panel.       Relevant Medications   amLODipine (NORVASC) 5 MG tablet   Stress   Continue zoloft. Overall handling things well. Follow.       Other Visit Diagnoses       Colon cancer screening       Relevant Orders   Ambulatory referral to Gastroenterology       No follow-ups on file.   I discussed the assessment and treatment plan with the patient. The patient was provided an opportunity to ask questions and all were answered. The patient agreed with the plan and demonstrated an understanding of the instructions.   The patient was advised to call back or seek an in-person evaluation if the symptoms worsen or if the condition fails to improve  as anticipated.   Dale Saucier, MD

## 2023-09-20 NOTE — Assessment & Plan Note (Signed)
 Low-carb diet and exercise.  Follow met b and A1c.

## 2023-09-20 NOTE — Assessment & Plan Note (Signed)
 Blood pressure as outlined. Given low blood pressure, will hold hydrochlorothiazide. Follow pressures. Follow metabolic panel.

## 2023-09-20 NOTE — Assessment & Plan Note (Signed)
 Continue zoloft. Overall handling things well. Follow.

## 2023-09-22 ENCOUNTER — Telehealth: Payer: Self-pay

## 2023-09-22 NOTE — Telephone Encounter (Signed)
 Contacted patient to discuss colonoscopy referral.  Her last colonoscopy was 05/26/2017 she said she thought she was good for 10 years. Her health maintenance notes she is due 05/27/27.  She said she feels that is right.  Informed her that she did have colon polyps.  She said to disregard the referral.  She doesn't think she needs it until 2028.  Thanks, [place], [place]

## 2023-09-27 ENCOUNTER — Other Ambulatory Visit: Payer: Self-pay

## 2023-11-15 ENCOUNTER — Other Ambulatory Visit: Payer: Self-pay

## 2024-02-08 ENCOUNTER — Other Ambulatory Visit: Payer: Self-pay

## 2024-02-22 ENCOUNTER — Telehealth: Payer: Self-pay | Admitting: Internal Medicine

## 2024-02-22 NOTE — Telephone Encounter (Signed)
 Left message to call office to schedule a [facility] appointment with Dr Glendia. This appointment needs to be scheduled sometime this year, 1 hour appointment.

## 2024-05-20 ENCOUNTER — Ambulatory Visit (INDEPENDENT_AMBULATORY_CARE_PROVIDER_SITE_OTHER)

## 2024-05-20 DIAGNOSIS — Z23 Encounter for immunization: Secondary | ICD-10-CM | POA: Diagnosis not present

## 2024-05-22 ENCOUNTER — Other Ambulatory Visit: Payer: Self-pay

## 2024-05-22 ENCOUNTER — Other Ambulatory Visit: Payer: Self-pay | Admitting: Internal Medicine

## 2024-05-22 MED ORDER — AMLODIPINE BESYLATE 5 MG PO TABS
5.0000 mg | ORAL_TABLET | Freq: Every day | ORAL | 1 refills | Status: DC
  Filled 2024-05-22: qty 90, 90d supply, fill #0

## 2024-07-10 ENCOUNTER — Ambulatory Visit: Admitting: Internal Medicine

## 2024-07-10 ENCOUNTER — Encounter: Payer: Self-pay | Admitting: Internal Medicine

## 2024-07-10 ENCOUNTER — Other Ambulatory Visit: Payer: Self-pay

## 2024-07-10 VITALS — BP 128/78 | HR 75 | Temp 99.3°F | Ht 61.0 in | Wt 181.4 lb

## 2024-07-10 DIAGNOSIS — Z1231 Encounter for screening mammogram for malignant neoplasm of breast: Secondary | ICD-10-CM

## 2024-07-10 DIAGNOSIS — Z0000 Encounter for general adult medical examination without abnormal findings: Secondary | ICD-10-CM

## 2024-07-10 DIAGNOSIS — I10 Essential (primary) hypertension: Secondary | ICD-10-CM

## 2024-07-10 DIAGNOSIS — Z23 Encounter for immunization: Secondary | ICD-10-CM

## 2024-07-10 DIAGNOSIS — Z1239 Encounter for other screening for malignant neoplasm of breast: Secondary | ICD-10-CM | POA: Insufficient documentation

## 2024-07-10 MED ORDER — AMLODIPINE BESYLATE 5 MG PO TABS
5.0000 mg | ORAL_TABLET | Freq: Every day | ORAL | 1 refills | Status: AC
  Filled 2024-07-10: qty 90, 90d supply, fill #0

## 2024-07-10 MED ORDER — SERTRALINE HCL 50 MG PO TABS
50.0000 mg | ORAL_TABLET | Freq: Every day | ORAL | 1 refills | Status: AC
  Filled 2024-07-10: qty 90, 90d supply, fill #0

## 2024-07-10 NOTE — Progress Notes (Signed)
 No chief complaint on file.    Subjective:   Ashley Waller is a 63 y.o. female who presents for a Welcome to Medicare Exam.   Visit info / Clinical Intake: Medicare Wellness Visit Type:: Welcome to [employer] GOVERNMENT SOCIAL RESEARCH OFFICER) Persons participating in visit and providing information:: patient Medicare Wellness Visit Mode:: In-person (required for WTM) Interpreter Needed?: No Pre-visit prep was completed: yes AWV questionnaire completed by patient prior to visit?: no Living arrangements:: lives with spouse/significant other Patient's Overall Health Status Rating: good Typical amount of pain: none Does pain affect daily life?: no Are you currently prescribed opioids?: no  Dietary Habits and Nutritional Risks How many meals a day?: 3 Eats fruit and vegetables daily?: yes Most meals are obtained by: preparing own meals In the last 2 weeks, have you had any of the following?: none Diabetic:: no  Functional Status Activities of Daily Living (to include ambulation/medication): Independent Ambulation: Independent Medication Administration: Independent Home Management (perform basic housework or laundry): Independent Manage your own finances?: yes Primary transportation is: driving Concerns about vision?: no *vision screening is required for WTM* Concerns about hearing?: no  Fall Screening Falls in the past year?: 0 Number of falls in past year: 0 Was there an injury with Fall?: 0 Fall Risk Category Calculator: 0 Patient Fall Risk Level: Low Fall Risk  Fall Risk Patient at Risk for Falls Due to: No Fall Risks Fall risk Follow up: Falls evaluation completed  Home and Transportation Safety: All rugs have non-skid backing?: (!) no All stairs or steps have railings?: yes Grab bars in the bathtub or shower?: yes Have non-skid surface in bathtub or shower?: yes Good home lighting?: yes Regular seat belt use?: yes Hospital stays in the last year:: no  Cognitive Assessment Difficulty  concentrating, remembering, or making decisions? : no Will 6CIT or Mini Cog be Completed: no 6CIT or Mini Cog Declined: patient alert, oriented, able to answer questions appropriately and recall recent events  Advance Directives (For Healthcare) Does Patient Have a Medical Advance Directive?: Yes Does patient want to make changes to medical advance directive?: No - Patient declined Type of Advance Directive: Healthcare Power of Attorney Copy of Healthcare Power of Attorney in Chart?: No - copy requested  Reviewed/Updated  Reviewed/Updated: Reviewed All (Medical, Surgical, Family, Medications, Allergies, Care Teams, Patient Goals); Medical History; Surgical History; Family History; Medications; Allergies; Care Teams; Patient Goals    Allergies (verified) Penicillins   Current Medications (verified) Outpatient Encounter Medications as of 07/10/2024  Medication Sig   acetaminophen (TYLENOL) 325 MG tablet Take 650 mg by mouth every 6 (six) hours as needed for pain.   albuterol  (VENTOLIN  HFA) 108 (90 Base) MCG/ACT inhaler Inhale 2 puffs into the lungs every 6 (six) hours as needed for wheezing or shortness of breath.   amLODipine  (NORVASC ) 5 MG tablet Take 1 tablet (5 mg total) by mouth daily.   Cetirizine HCl (ZYRTEC PO) Take by mouth.   fluticasone  (FLONASE ) 50 MCG/ACT nasal spray Place 2 sprays into both nostrils daily. Hold until pt request   sertraline  (ZOLOFT ) 50 MG tablet Take 1 tablet (50 mg total) by mouth daily.   [DISCONTINUED] amLODipine  (NORVASC ) 5 MG tablet Take 1 tablet (5 mg total) by mouth daily.   [DISCONTINUED] sertraline  (ZOLOFT ) 50 MG tablet Take 1 tablet (50 mg total) by mouth daily.   No facility-administered encounter medications on file as of 07/10/2024.    History: Past Medical History:  Diagnosis Date   Abnormal mammogram 10/15/2012  Adult BMI 35.0-35.9 kg/sq m 01/31/2017   Arthritis    Asthma    Chest pain 03/13/2016   Health care maintenance 11/30/2014    Mammogram 12/22/15 - Birads 0.  Recommended f/u right breat mammogram.  Follow up right breast mammogram 01/11/16 - Birads I.  Recommended f/u mammogram in one year.      Hematuria    Hemorrhoid    History of colonic polyps 02/27/2015   Hypercholesterolemia 07/16/2012   Hypertension    Menopausal syndrome 07/22/2012   Plantar fasciitis of left foot 07/15/2013   Stress 05/17/2014   Past Surgical History:  Procedure Laterality Date   BREAST BIOPSY Right 07/12/2023   u/s bx 2 3cmfn, venus clip path pending   BREAST BIOPSY Right 07/12/2023   US  RT BREAST BX W LOC DEV 1ST LESION IMG BX SPEC US  GUIDE 07/12/2023 [facility]   COLONOSCOPY  08/02/2011   EYELID LACERATION REPAIR  08/01/2004   TUBAL LIGATION  08/01/1993   Family History  Problem Relation Age of Onset   Hyperlipidemia Mother    Diabetes Mother    Hypertension Mother    Hyperlipidemia Father    Diabetes Father    Hypertension Father    Mental illness Maternal Grandmother    Mental illness Maternal Grandfather    Mental illness Paternal Grandmother    Mental illness Paternal Grandfather    Breast cancer Neg Hx    Social History   Occupational History   Not on file  Tobacco Use   Smoking status: Never   Smokeless tobacco: Never  Substance and Sexual Activity   Alcohol use: Yes    Alcohol/week: 0.0 - 1.0 standard drinks of alcohol    Comment: ocassionally   Drug use: No   Sexual activity: Not on file   Tobacco Counseling Counseling given: Not Answered  SDOH Screenings   Food Insecurity: No Food Insecurity (07/10/2024)  Housing: High Risk (07/10/2024)  Transportation Needs: No Transportation Needs (07/10/2024)  Utilities: Not At Risk (07/10/2024)  Depression (PHQ2-9): Low Risk  (07/10/2024)  Physical Activity: Sufficiently Active (07/10/2024)  Social Connections: Moderately Isolated (07/10/2024)  Stress: No Stress Concern Present (07/10/2024)  Tobacco Use: Low Risk  (07/10/2024)  Health Literacy:  Adequate Health Literacy (07/10/2024)   See flowsheets for full screening details  Depression Screen PHQ 2 & 9 Depression Scale- Over the past 2 weeks, how often have you been bothered by any of the following problems? Little interest or pleasure in doing things: 0 Feeling down, depressed, or hopeless (PHQ Adolescent also includes...irritable): 0 PHQ-2 Total Score: 0 Trouble falling or staying asleep, or sleeping too much: 0 Feeling tired or having little energy: 0 Poor appetite or overeating (PHQ Adolescent also includes...weight loss): 0 Feeling bad about yourself - or that you are a failure or have let yourself or your family down: 0 Trouble concentrating on things, such as reading the newspaper or watching television (PHQ Adolescent also includes...like school work): 0 Moving or speaking so slowly that other people could have noticed. Or the opposite - being so fidgety or restless that you have been moving around a lot more than usual: 0 Thoughts that you would be better off dead, or of hurting yourself in some way: 0 PHQ-9 Total Score: 0 If you checked off any problems, how difficult have these problems made it for you to do your work, take care of things at home, or get along with other people?: Not difficult at all      Goals Addressed  None          Objective:    Today's Vitals   07/10/24 1336  BP: 128/78  Pulse: 75  Temp: 99.3 F (37.4 C)  SpO2: 96%  Weight: 181 lb 6.4 oz (82.3 kg)  Height: 5' 1 (1.549 m)   Body mass index is 34.28 kg/m.   Physical Exam Constitutional:      Appearance: Normal appearance.  HENT:     Head: Normocephalic and atraumatic.  Cardiovascular:     Rate and Rhythm: Normal rate and regular rhythm.     Heart sounds: Normal heart sounds.  Pulmonary:     Effort: Pulmonary effort is normal.     Breath sounds: Normal breath sounds. No wheezing, rhonchi or rales.  Abdominal:     General: Bowel sounds are normal. There is no distension.      Palpations: Abdomen is soft.     Tenderness: There is no abdominal tenderness. There is no guarding or rebound.  Musculoskeletal:        General: No swelling or tenderness.     Right lower leg: No edema.     Left lower leg: No edema.  Neurological:     Mental Status: She is alert.  Psychiatric:        Mood and Affect: Mood normal.        Behavior: Behavior normal.       Hearing/Vision screen Vision Screening   Right eye Left eye Both eyes  Without correction 20/40 20/50 20/30   With correction      Immunizations and Health Maintenance Health Maintenance  Topic Date Due   Pneumococcal Vaccine: 50+ Years (1 of 1 - PCV) Never done   Zoster Vaccines- Shingrix (1 of 2) Never done   Bone Density Scan  Never done   COVID-19 Vaccine (8 - 2025-26 season) 04/01/2024   Mammogram  06/26/2024   Medicare Annual Wellness (AWV)  07/10/2025   Cervical Cancer Screening (HPV/Pap Cotest)  12/29/2025   Colonoscopy  05/27/2027   Influenza Vaccine  Completed   Hepatitis C Screening  Completed   HIV Screening  Completed   Hepatitis B Vaccines 19-59 Average Risk  Aged Out   Meningococcal B Vaccine  Aged Out   DTaP/Tdap/Td  Discontinued    EKG: normal sinus rhythm, incomplete right bundle branch block noted unchanged from previous tracings     Assessment/Plan:  This is a routine wellness examination for Gem.  Patient Care Team: Glendia Shad, MD as PCP - General (Internal Medicine) Dessa Reyes ORN, MD (General Surgery)  I have personally reviewed and noted the following in the patients chart:   Medical and social history Use of alcohol, tobacco or illicit drugs  Current medications and supplements including opioid prescriptions. Functional ability and status Nutritional status Physical activity Advanced directives List of other physicians Hospitalizations, surgeries, and ER visits in previous 12 months Vitals Screenings to include cognitive, depression, and  falls Referrals and appointments  Orders Placed This Encounter  Procedures   MM 3D SCREENING MAMMOGRAM BILATERAL BREAST    Standing Status:   Future    Expiration Date:   07/10/2025    Reason for Exam (SYMPTOM  OR DIAGNOSIS REQUIRED):   screening for breast cancer    Preferred imaging location?:   [facility]   EKG 12-Lead   In addition, I have reviewed and discussed with patient certain preventive protocols, quality metrics, and best practice recommendations. A written personalized care plan for preventive services as well as general  preventive health recommendations were provided to patient.   Dr. Hans Baptist  07/10/2024   Return in 1 year (on 07/10/2025).   Problem List Items Addressed This Visit       Cardiovascular and Mediastinum   Hypertension   - This progress chronic and stable -Blood pressure is well-controlled today at 128/78 (at goal) -Continue with amlodipine  5 mg daily -Patient would like to defer blood work till her appointment with her PCP next month -No further workup at this time      Relevant Medications   amLODipine  (NORVASC ) 5 MG tablet     Other   Breast cancer screening   - Patient due for mammogram screening -Previous mammogram did have a 6 mm indeterminant lesion which was aspirated and biopsied.  Biopsy showed no evidence of malignancy or atypia -Patient is due for repeat mammogram -Order placed today -No further workup at this time      Relevant Orders   MM 3D SCREENING MAMMOGRAM BILATERAL BREAST   Other Visit Diagnoses       Welcome to Medicare preventive visit    -  Primary   Relevant Orders   EKG 12-Lead (Completed)     Encounter for Medicare annual wellness exam

## 2024-07-10 NOTE — Patient Instructions (Addendum)
 Ms. Kukla,  Thank you for taking the time for your Medicare Wellness Visit. I appreciate your continued commitment to your health goals. Please review the care plan we discussed, and feel free to reach out if I can assist you further.  Please note that Annual Wellness Visits do not include a physical exam. Some assessments may be limited, especially if the visit was conducted virtually. If needed, we may recommend an in-person follow-up with your provider.  Ongoing Care Seeing your primary care provider every 3 to 6 months helps us  monitor your health and provide consistent, personalized care.   Referrals If a referral was made during today's visit and you haven't received any updates within two weeks, please contact the referred provider directly to check on the status.  Recommended Screenings:  Health Maintenance  Topic Date Due   Pneumococcal Vaccine for age over 34 (1 of 1 - PCV) Never done   Zoster (Shingles) Vaccine (1 of 2) Never done   Osteoporosis screening with Bone Density Scan  Never done   COVID-19 Vaccine (8 - 2025-26 season) 04/01/2024   Breast Cancer Screening  06/26/2024   Medicare Annual Wellness Visit  07/10/2025   Pap with HPV screening  12/29/2025   Colon Cancer Screening  05/27/2027   Flu Shot  Completed   Hepatitis C Screening  Completed   HIV Screening  Completed   Hepatitis B Vaccine  Aged Out   Meningitis B Vaccine  Aged Out   DTaP/Tdap/Td vaccine  Discontinued       07/10/2024    1:38 PM  Advanced Directives  Does Patient Have a Medical Advance Directive? Yes  Type of Advance Directive Healthcare Power of Attorney  Does patient want to make changes to medical advance directive? No - Patient declined  Copy of Healthcare Power of Attorney in Chart? No - copy requested    Vision: Annual vision screenings are recommended for early detection of glaucoma, cataracts, and diabetic retinopathy. These exams can also reveal signs of chronic conditions such as  diabetes and high blood pressure.  Dental: Annual dental screenings help detect early signs of oral cancer, gum disease, and other conditions linked to overall health, including heart disease and diabetes.  Please see the attached documents for additional preventive care recommendations.    Please follow-up with Dr. Glendia in 1 month and get your blood work done at that time.  I have put in an order for your mammogram screening.  Please get this done when you can.

## 2024-07-10 NOTE — Assessment & Plan Note (Addendum)
-   This progress chronic and stable -Blood pressure is well-controlled today at 128/78 (at goal) -Continue with amlodipine  5 mg daily -Patient would like to defer blood work till her appointment with her PCP next month -No further workup at this time

## 2024-07-10 NOTE — Addendum Note (Signed)
 Addended by: SEBASTIAN NORRIS A on: 07/10/2024 02:47 PM   Modules accepted: Orders

## 2024-07-10 NOTE — Assessment & Plan Note (Signed)
-   Patient due for mammogram screening -Previous mammogram did have a 6 mm indeterminant lesion which was aspirated and biopsied.  Biopsy showed no evidence of malignancy or atypia -Patient is due for repeat mammogram -Order placed today -No further workup at this time

## 2024-08-09 ENCOUNTER — Encounter: Payer: Self-pay | Admitting: Internal Medicine

## 2024-08-09 NOTE — Telephone Encounter (Signed)
 Fyi only. Did try to call pt lvm for pt to return call. Also sent pt a mychart message as well

## 2024-08-12 ENCOUNTER — Encounter: Admitting: Internal Medicine

## 2024-08-13 ENCOUNTER — Other Ambulatory Visit (HOSPITAL_COMMUNITY)
Admission: RE | Admit: 2024-08-13 | Discharge: 2024-08-13 | Disposition: A | Discharge status: Home / Self Care | Source: Ambulatory Visit | Attending: Internal Medicine | Admitting: Internal Medicine

## 2024-08-13 ENCOUNTER — Ambulatory Visit: Admitting: Internal Medicine

## 2024-08-13 ENCOUNTER — Encounter: Payer: Self-pay | Admitting: Internal Medicine

## 2024-08-13 VITALS — BP 136/80 | HR 75 | Temp 98.3°F | Ht 61.0 in | Wt 174.4 lb

## 2024-08-13 DIAGNOSIS — F439 Reaction to severe stress, unspecified: Secondary | ICD-10-CM | POA: Diagnosis not present

## 2024-08-13 DIAGNOSIS — Z124 Encounter for screening for malignant neoplasm of cervix: Secondary | ICD-10-CM | POA: Insufficient documentation

## 2024-08-13 DIAGNOSIS — I10 Essential (primary) hypertension: Secondary | ICD-10-CM

## 2024-08-13 DIAGNOSIS — R739 Hyperglycemia, unspecified: Secondary | ICD-10-CM | POA: Diagnosis not present

## 2024-08-13 DIAGNOSIS — Z1151 Encounter for screening for human papillomavirus (HPV): Secondary | ICD-10-CM | POA: Diagnosis not present

## 2024-08-13 DIAGNOSIS — Z01419 Encounter for gynecological examination (general) (routine) without abnormal findings: Secondary | ICD-10-CM | POA: Diagnosis present

## 2024-08-13 DIAGNOSIS — R079 Chest pain, unspecified: Secondary | ICD-10-CM

## 2024-08-13 DIAGNOSIS — R55 Syncope and collapse: Secondary | ICD-10-CM | POA: Diagnosis not present

## 2024-08-13 DIAGNOSIS — E7800 Pure hypercholesterolemia, unspecified: Secondary | ICD-10-CM | POA: Diagnosis not present

## 2024-08-13 DIAGNOSIS — Z0000 Encounter for general adult medical examination without abnormal findings: Secondary | ICD-10-CM

## 2024-08-13 DIAGNOSIS — Z1231 Encounter for screening mammogram for malignant neoplasm of breast: Secondary | ICD-10-CM | POA: Diagnosis not present

## 2024-08-13 DIAGNOSIS — E2839 Other primary ovarian failure: Secondary | ICD-10-CM | POA: Diagnosis not present

## 2024-08-13 LAB — LIPID PANEL
Cholesterol: 209 mg/dL — ABNORMAL HIGH (ref 28–200)
HDL: 51.6 mg/dL
LDL Cholesterol: 122 mg/dL — ABNORMAL HIGH (ref 10–99)
NonHDL: 157.48
Total CHOL/HDL Ratio: 4
Triglycerides: 176 mg/dL — ABNORMAL HIGH (ref 10.0–149.0)
VLDL: 35.2 mg/dL (ref 0.0–40.0)

## 2024-08-13 LAB — HEPATIC FUNCTION PANEL
ALT: 11 U/L (ref 3–35)
AST: 14 U/L (ref 5–37)
Albumin: 4.6 g/dL (ref 3.5–5.2)
Alkaline Phosphatase: 89 U/L (ref 39–117)
Bilirubin, Direct: 0.1 mg/dL (ref 0.1–0.3)
Total Bilirubin: 0.6 mg/dL (ref 0.2–1.2)
Total Protein: 7.2 g/dL (ref 6.0–8.3)

## 2024-08-13 LAB — BASIC METABOLIC PANEL WITH GFR
BUN: 10 mg/dL (ref 6–23)
CO2: 26 meq/L (ref 19–32)
Calcium: 9.5 mg/dL (ref 8.4–10.5)
Chloride: 104 meq/L (ref 96–112)
Creatinine, Ser: 0.54 mg/dL (ref 0.40–1.20)
GFR: 96.25 mL/min
Glucose, Bld: 109 mg/dL — ABNORMAL HIGH (ref 70–99)
Potassium: 3.6 meq/L (ref 3.5–5.1)
Sodium: 140 meq/L (ref 135–145)

## 2024-08-13 LAB — CBC WITH DIFFERENTIAL/PLATELET
Basophils Absolute: 0 K/uL (ref 0.0–0.1)
Basophils Relative: 0.8 % (ref 0.0–3.0)
Eosinophils Absolute: 0.1 K/uL (ref 0.0–0.7)
Eosinophils Relative: 1.4 % (ref 0.0–5.0)
HCT: 41.2 % (ref 36.0–46.0)
Hemoglobin: 14.2 g/dL (ref 12.0–15.0)
Lymphocytes Relative: 40.4 % (ref 12.0–46.0)
Lymphs Abs: 2.1 K/uL (ref 0.7–4.0)
MCHC: 34.4 g/dL (ref 30.0–36.0)
MCV: 86.7 fl (ref 78.0–100.0)
Monocytes Absolute: 0.4 K/uL (ref 0.1–1.0)
Monocytes Relative: 7.2 % (ref 3.0–12.0)
Neutro Abs: 2.6 K/uL (ref 1.4–7.7)
Neutrophils Relative %: 50.2 % (ref 43.0–77.0)
Platelets: 323 K/uL (ref 150.0–400.0)
RBC: 4.76 Mil/uL (ref 3.87–5.11)
RDW: 12.7 % (ref 11.5–15.5)
WBC: 5.1 K/uL (ref 4.0–10.5)

## 2024-08-13 LAB — HEMOGLOBIN A1C: Hgb A1c MFr Bld: 5.4 % (ref 4.6–6.5)

## 2024-08-13 LAB — TSH: TSH: 2.57 u[IU]/mL (ref 0.35–5.50)

## 2024-08-13 NOTE — Progress Notes (Signed)
 "  Subjective:    Patient ID: Ashley Waller, female    DOB: 04-18-1961, 64 y.o.   MRN: [id number]  Patient here for  Chief Complaint  Patient presents with   Annual Exam    HPI Here for a physical exam. Retired 05/2022. Doing well since retirement. Continues on zoloft . Reports [place], some alcohol, etc - noticed tunnel vision - passed out. This summer - was up and down stairs a lot. Had another episode. No problems. Since. Stays active. Some occasional chest discomfort. Breathing overall stable. No increased cough or congestion. No abdominal pain or bowel change reported. Discussed scheduling mammogram and bone density.    Past Medical History:  Diagnosis Date   Abnormal mammogram 10/15/2012   Adult BMI 35.0-35.9 kg/sq m 01/31/2017   Arthritis    Asthma    Chest pain 03/13/2016   Health care maintenance 11/30/2014   Mammogram 12/22/15 - [facility] 0.  Recommended f/u right breat mammogram.  Follow up right breast mammogram 01/11/16 - Birads I.  Recommended f/u mammogram in one year.      Hematuria    Hemorrhoid    History of colonic polyps 02/27/2015   Hypercholesterolemia 07/16/2012   Hypertension    Menopausal syndrome 07/22/2012   Plantar fasciitis of left foot 07/15/2013   Stress 05/17/2014   Past Surgical History:  Procedure Laterality Date   BREAST BIOPSY Right 07/12/2023   u/s bx 2 3cmfn, venus clip path pending   BREAST BIOPSY Right 07/12/2023   US  RT BREAST BX W LOC DEV 1ST LESION IMG BX SPEC US  GUIDE 07/12/2023 [facility]   COLONOSCOPY  08/02/2011   EYELID LACERATION REPAIR  08/01/2004   TUBAL LIGATION  08/01/1993   Family History  Problem Relation Age of Onset   Hyperlipidemia Mother    Diabetes Mother    Hypertension Mother    Hyperlipidemia Father    Diabetes Father    Hypertension Father    Mental illness Maternal Grandmother    Mental illness Maternal Grandfather    Mental illness Paternal Grandmother     Mental illness Paternal Grandfather    Breast cancer Neg Hx    Social History   Socioeconomic History   Marital status: Married    Spouse name: Not on file   Number of children: Not on file   Years of education: Not on file   Highest education level: Bachelor's degree (e.g., BA, AB, BS)  Occupational History   Not on file  Tobacco Use   Smoking status: Never   Smokeless tobacco: Never  Substance and Sexual Activity   Alcohol use: Not Currently    Alcohol/week: 1.0 standard drink of alcohol    Types: 1 Standard drinks or equivalent per week    Comment: ocassionally   Drug use: Never   Sexual activity: Not Currently    Birth control/protection: Abstinence  Other Topics Concern   Not on file  Social History Narrative   Not on file   Social Drivers of Health   Tobacco Use: Low Risk (08/18/2024)   Patient History    Smoking Tobacco Use: Never    Smokeless Tobacco Use: Never    Passive Exposure: Not on file  Financial Resource Strain: Low Risk (08/11/2024)   Overall Financial Resource Strain (CARDIA)    Difficulty of Paying Living Expenses: Not hard at all  Food Insecurity: No Food Insecurity (08/11/2024)   Epic    Worried About  Running Out of Food in the Last Year: Never true    Ran Out of Food in the Last Year: Never true  Transportation Needs: No Transportation Needs (08/11/2024)   Epic    Lack of Transportation (Medical): No    Lack of Transportation (Non-Medical): No  Physical Activity: Insufficiently Active (08/11/2024)   Exercise Vital Sign    Days of Exercise per Week: 3 days    Minutes of Exercise per Session: 40 min  Stress: No Stress Concern Present (08/11/2024)   [employer] of Occupational Health - Occupational Stress Questionnaire    Feeling of Stress: Only a little  Social Connections: Moderately Integrated (08/11/2024)   Social Connection and Isolation Panel    Frequency of Communication with Friends and Family: Three times a week    Frequency of  Social Gatherings with Friends and Family: Twice a week    Attends Religious Services: Patient declined    Database Administrator or Organizations: Yes    Attends Engineer, Structural: More than 4 times per year    Marital Status: Married  Recent Concern: Social Connections - Moderately Isolated (07/10/2024)   Social Connection and Isolation Panel    Frequency of Communication with Friends and Family: More than three times a week    Frequency of Social Gatherings with Friends and Family: Once a week    Attends Religious Services: Never    Database Administrator or Organizations: No    Attends Banker Meetings: Never    Marital Status: Married  Depression (PHQ2-9): Low Risk (07/10/2024)   Depression (PHQ2-9)    PHQ-2 Score: 0  Alcohol Screen: Low Risk (08/11/2024)   Alcohol Screen    Last Alcohol Screening Score (AUDIT): 1  Housing: Low Risk (08/11/2024)   Epic    Unable to Pay for Housing in the Last Year: No    Number of Times Moved in the Last Year: 0    Homeless in the Last Year: No  Recent Concern: Housing - High Risk (07/10/2024)   Epic    Unable to Pay for Housing in the Last Year: No    Number of Times Moved in the Last Year: Not on file    Homeless in the Last Year: Yes  Utilities: Not At Risk (07/10/2024)   Epic    Threatened with loss of utilities: No  Health Literacy: Adequate Health Literacy (07/10/2024)   B1300 Health Literacy    Frequency of need for help with medical instructions: Never     Review of Systems  Constitutional:  Negative for appetite change and unexpected weight change.  HENT:  Negative for congestion, sinus pressure and sore throat.   Eyes:  Negative for pain and visual disturbance.  Respiratory:  Negative for cough and shortness of breath.   Cardiovascular:  Negative for palpitations and leg swelling.       Occasional chest discomfort as outlined.   Gastrointestinal:  Negative for abdominal pain, diarrhea, nausea and  vomiting.  Genitourinary:  Negative for difficulty urinating and dysuria.  Musculoskeletal:  Negative for back pain and joint swelling.  Skin:  Negative for color change and rash.  Neurological:  Negative for dizziness and headaches.  Hematological:  Negative for adenopathy. Does not bruise/bleed easily.  Psychiatric/Behavioral:  Negative for agitation and dysphoric mood.        Objective:     BP 136/80   Pulse 75   Temp 98.3 F (36.8 C) (Oral)   Ht 5' 1 (  1.549 m)   Wt 174 lb 6.4 oz (79.1 kg)   LMP 07/16/2009   SpO2 99%   BMI 32.95 kg/m  Wt Readings from Last 3 Encounters:  08/13/24 174 lb 6.4 oz (79.1 kg)  07/10/24 181 lb 6.4 oz (82.3 kg)  09/20/23 176 lb (79.8 kg)    Physical Exam Vitals reviewed.  Constitutional:      General: She is not in acute distress.    Appearance: Normal appearance. She is well-developed.  HENT:     Head: Normocephalic and atraumatic.     Right Ear: External ear normal.     Left Ear: External ear normal.     Mouth/Throat:     Pharynx: No oropharyngeal exudate or posterior oropharyngeal erythema.  Eyes:     General: No scleral icterus.       Right eye: No discharge.        Left eye: No discharge.     Conjunctiva/sclera: Conjunctivae normal.  Neck:     Thyroid : No thyromegaly.  Cardiovascular:     Rate and Rhythm: Normal rate and regular rhythm.  Pulmonary:     Effort: No tachypnea, accessory muscle usage or respiratory distress.     Breath sounds: Normal breath sounds. No decreased breath sounds or wheezing.  Chest:  Breasts:    Right: No inverted nipple, mass, nipple discharge or tenderness (no axillary adenopathy).     Left: No inverted nipple, mass, nipple discharge or tenderness (no axilarry adenopathy).  Abdominal:     General: Bowel sounds are normal.     Palpations: Abdomen is soft.     Tenderness: There is no abdominal tenderness.  Genitourinary:    Comments: Normal external genitalia.  Vaginal vault without lesions.   Cervix identified.  Pap smear performed.  Could not appreciate any adnexal masses or tenderness.   Musculoskeletal:        General: No swelling or tenderness.     Cervical back: Neck supple.  Lymphadenopathy:     Cervical: No cervical adenopathy.  Skin:    Findings: No erythema or rash.  Neurological:     Mental Status: She is alert and oriented to person, place, and time.  Psychiatric:        Mood and Affect: Mood normal.        Behavior: Behavior normal.         Outpatient Encounter Medications as of 08/13/2024  Medication Sig   acetaminophen (TYLENOL) 325 MG tablet Take 650 mg by mouth every 6 (six) hours as needed for pain.   albuterol  (VENTOLIN  HFA) 108 (90 Base) MCG/ACT inhaler Inhale 2 puffs into the lungs every 6 (six) hours as needed for wheezing or shortness of breath.   amLODipine  (NORVASC ) 5 MG tablet Take 1 tablet (5 mg total) by mouth daily.   BIOTIN PO Take by mouth.   Cetirizine HCl (ZYRTEC PO) Take by mouth.   fluticasone  (FLONASE ) 50 MCG/ACT nasal spray Place 2 sprays into both nostrils daily. Hold until pt request   sertraline  (ZOLOFT ) 50 MG tablet Take 1 tablet (50 mg total) by mouth daily.   No facility-administered encounter medications on file as of 08/13/2024.     Lab Results  Component Value Date   WBC 5.1 08/13/2024   HGB 14.2 08/13/2024   HCT 41.2 08/13/2024   PLT 323.0 08/13/2024   GLUCOSE 109 (H) 08/13/2024   CHOL 209 (H) 08/13/2024   TRIG 176.0 (H) 08/13/2024   HDL 51.60 08/13/2024   LDLDIRECT 140.0 11/16/2022  LDLCALC 122 (H) 08/13/2024   ALT 11 08/13/2024   AST 14 08/13/2024   NA 140 08/13/2024   K 3.6 08/13/2024   CL 104 08/13/2024   CREATININE 0.54 08/13/2024   BUN 10 08/13/2024   CO2 26 08/13/2024   TSH 2.57 08/13/2024   HGBA1C 5.4 08/13/2024    US  RT BREAST BX W LOC DEV 1ST LESION IMG BX SPEC US  GUIDE Addendum Date: 07/17/2023 ADDENDUM REPORT: 07/17/2023 13:00 ADDENDUM: PATHOLOGY revealed: 1. Breast, right, needle core  biopsy, 2:00; 3 cmfn: - FRAGMENTS OF CYST WALL. - NEGATIVE FOR ATYPIA AND MALIGNANCY. Pathology results are CONCORDANT with imaging findings, per Dr. Corean Salter. Pathology results and recommendations were discussed with patient via telephone on 07/13/2023 by Rock Hover RN. Patient reported biopsy site doing well with no adverse symptoms, and only slight tenderness at the site. Post biopsy care instructions were reviewed, questions were answered and my direct phone number was provided. Patient was instructed to call [facility] for any additional questions or concerns related to biopsy site. RECOMMENDATION: Patient instructed to resume annual bilateral screening mammogram due November 2025. Pathology results reported by Rock Hover RN on 07/17/2023. Electronically Signed   By: Corean Salter M.D.   On: 07/17/2023 13:00   Result Date: 07/17/2023 CLINICAL DATA:  Indeterminate mass EXAM: ULTRASOUND GUIDED RIGHT BREAST CORE NEEDLE BIOPSY COMPARISON:  Previous exam(s). PROCEDURE: I met with the patient and we discussed the procedure of ultrasound-guided biopsy, including benefits and alternatives. We discussed the high likelihood of a successful procedure. We discussed the risks of the procedure, including infection, bleeding, tissue injury, clip migration, and inadequate sampling. Informed written consent was given. The usual time-out protocol was performed immediately prior to the procedure. Lesion quadrant: Upper inner quadrant Using sterile technique and 1% lidocaine  and 1% lidocaine  with epinephrine  as local anesthetic, under direct ultrasound visualization, a 14 gauge spring-loaded device was used to perform biopsy of a mass at 2 o'clock 3 cm from the nipple using a medial approach after attempted aspiration was unsuccessful. At the conclusion of the procedure a venus shaped tissue marker clip was deployed into the biopsy cavity. Follow up 2 view mammogram was performed and dictated  separately. IMPRESSION: Ultrasound guided biopsy of a RIGHT breast mass. No apparent complications. Electronically Signed: By: Corean Salter M.D. On: 07/12/2023 14:00   MM CLIP PLACEMENT RIGHT Result Date: 07/12/2023 CLINICAL DATA:  Indeterminate RIGHT breast mass EXAM: 3D DIAGNOSTIC RIGHT MAMMOGRAM POST ULTRASOUND BIOPSY COMPARISON:  Previous exam(s). FINDINGS: 3D Mammographic images were obtained following ultrasound guided biopsy of a mass at 2 o'clock. The venus biopsy marking clip is in expected position at the site of biopsy. IMPRESSION: Appropriate positioning of the venus shaped biopsy marking clip at the site of biopsy in the upper inner breast. Final Assessment: Post Procedure Mammograms for Marker Placement Electronically Signed   By: Corean Salter M.D.   On: 07/12/2023 13:44       Assessment & Plan:  Routine general medical examination at a health care facility  Hyperglycemia Assessment & Plan: Low carb diet and exercise. Follow met b and A1c.   Orders: -     Hemoglobin A1c  Primary hypertension Assessment & Plan: Blood pressure as outlined. Currently on amlodipine  5mg  q day. Follow pressures. No documented low pressures. Stay hydrated. Follow metabolic panel.   Orders: -     Basic metabolic panel with GFR  Hypercholesterolemia Assessment & Plan: The 10-year ASCVD risk score (Arnett DK, et al., 2019)  is: 8.8%   Values used to calculate the score:     Age: 30 years     Clinically relevant sex: Female     Is Non-Hispanic African American: No     Diabetic: No     Tobacco smoker: No     Systolic Blood Pressure: 136 mmHg     Is BP treated: Yes     HDL Cholesterol: 51.6 mg/dL     Total Cholesterol: 209 mg/dL  Low cholesterol diet and exercise.  Follow lipid panel.   Orders: -     CBC with Differential/Platelet -     Hepatic function panel -     TSH -     Lipid panel  Encounter for screening mammogram for malignant neoplasm of breast -     3D Screening  Mammogram, Left and Right; Future  Health care maintenance Assessment & Plan: Physical today 08/13/24.  Mammogram 07/2023  - biopsy ok. Recommended to continue annual screening. Overdue.  Colonoscopy 05/2017.  Recommended f/u in 5 years.  PAP 12/29/20 - negative with negative HPV. Repeat pap today. Schedule mammogram.    Cervical cancer screening -     Cytology - PAP  Chest pain, unspecified type Assessment & Plan: Intermittent chest pain as outlined. Two previous syncopal episodes as outlined. Discussed possible etiologies = see below. EKG as outlined. Agreeable to referral to cardiology.   Orders: -     Ambulatory referral to Cardiology  Estrogen deficiency -     DG Bone Density; Future  Syncope, unspecified syncope type Assessment & Plan: First occurred a few years ago as outlined. Second also at the beach. Discussed importance of staying hydrated and not going long periods without eating. Has not recurred. Does notice occasional episodes of chest discomfort. Tries to stay active. EKG - SR with ventricular rate 67. No acute ischemic changes noted. Discussed further w/up - cardiology referral (possible ECHO, monitor). Discussed possible vaso vagal etiology. She could sense was getting ready to pass out. Agreeable to referral.   Orders: -     Ambulatory referral to Cardiology  Stress Assessment & Plan: Continue zoloft . Overall appears to be handling things relatively well. Follow.       Allena Hamilton, MD "

## 2024-08-13 NOTE — Assessment & Plan Note (Addendum)
 Physical today 08/13/24.  Mammogram 07/2023  - biopsy ok. Recommended to continue annual screening. Overdue.  Colonoscopy 05/2017.  Recommended f/u in 5 years.  PAP 12/29/20 - negative with negative HPV. Repeat pap today. Schedule mammogram.

## 2024-08-14 ENCOUNTER — Ambulatory Visit: Payer: Self-pay | Admitting: Internal Medicine

## 2024-08-14 LAB — CYTOLOGY - PAP
Comment: NEGATIVE
Diagnosis: NEGATIVE
High risk HPV: NEGATIVE

## 2024-08-18 ENCOUNTER — Encounter: Payer: Self-pay | Admitting: Internal Medicine

## 2024-08-18 NOTE — Assessment & Plan Note (Signed)
 Intermittent chest pain as outlined. Two previous syncopal episodes as outlined. Discussed possible etiologies = see below. EKG as outlined. Agreeable to referral to cardiology.

## 2024-08-18 NOTE — Assessment & Plan Note (Signed)
 Low-carb diet and exercise.  Follow met b and A1c.

## 2024-08-18 NOTE — Assessment & Plan Note (Signed)
 First occurred a few years ago as outlined. Second also at the beach. Discussed importance of staying hydrated and not going long periods without eating. Has not recurred. Does notice occasional episodes of chest discomfort. Tries to stay active. EKG - SR with ventricular rate 67. No acute ischemic changes noted. Discussed further w/up - cardiology referral (possible ECHO, monitor). Discussed possible vaso vagal etiology. She could sense was getting ready to pass out. Agreeable to referral.

## 2024-08-18 NOTE — Assessment & Plan Note (Signed)
 The 10-year ASCVD risk score (Arnett DK, et al., 2019) is: 8.8%   Values used to calculate the score:     Age: 64 years     Clinically relevant sex: Female     Is Non-Hispanic African American: No     Diabetic: No     Tobacco smoker: No     Systolic Blood Pressure: 136 mmHg     Is BP treated: Yes     HDL Cholesterol: 51.6 mg/dL     Total Cholesterol: 209 mg/dL  Low cholesterol diet and exercise.  Follow lipid panel.

## 2024-08-18 NOTE — Assessment & Plan Note (Signed)
 Blood pressure as outlined. Currently on amlodipine  5mg  q day. Follow pressures. No documented low pressures. Stay hydrated. Follow metabolic panel.

## 2024-08-18 NOTE — Assessment & Plan Note (Signed)
Continue zoloft.  Overall appears to be handling things relatively well.  Follow.  

## 2024-08-23 ENCOUNTER — Encounter: Payer: Self-pay | Admitting: Cardiovascular Disease

## 2024-08-23 ENCOUNTER — Ambulatory Visit: Attending: Cardiovascular Disease | Admitting: Cardiovascular Disease

## 2024-08-23 VITALS — BP 140/82 | HR 79 | Ht 61.0 in | Wt 176.0 lb

## 2024-08-23 DIAGNOSIS — R079 Chest pain, unspecified: Secondary | ICD-10-CM | POA: Diagnosis not present

## 2024-08-23 DIAGNOSIS — I10 Essential (primary) hypertension: Secondary | ICD-10-CM | POA: Diagnosis not present

## 2024-08-23 DIAGNOSIS — E7800 Pure hypercholesterolemia, unspecified: Secondary | ICD-10-CM

## 2024-08-23 DIAGNOSIS — R55 Syncope and collapse: Secondary | ICD-10-CM

## 2024-08-23 DIAGNOSIS — R739 Hyperglycemia, unspecified: Secondary | ICD-10-CM

## 2024-08-23 NOTE — Patient Instructions (Addendum)
 Medication Instructions:  No changes  If you need a refill on your cardiac medications before your next appointment, please call your pharmacy.   Lab work: No new labs needed  Testing/Procedures: No new testing needed    Coronary CalciumScan A coronary calcium scan is an imaging test used to look for deposits of calcium and other fatty materials (plaques) in the inner lining of the blood vessels of the heart (coronary arteries). These deposits of calcium and plaques can partly clog and narrow the coronary arteries without producing any symptoms or warning signs. This puts a person at risk for a heart attack. This test can detect these deposits before symptoms develop. Tell a health care provider about: Any allergies you have. All medicines you are taking, including vitamins, herbs, eye drops, creams, and over-the-counter medicines. Any problems you or family members have had with anesthetic medicines. Any blood disorders you have. Any surgeries you have had. Any medical conditions you have. Whether you are pregnant or may be pregnant. What are the risks? Generally, this is a safe procedure. However, problems may occur, including: Harm to a pregnant woman and her unborn baby. This test involves the use of radiation. Radiation exposure can be dangerous to a pregnant woman and her unborn baby. If you are pregnant, you generally should not have this procedure done. Slight increase in the risk of cancer. This is because of the radiation involved in the test. What happens before the procedure? No preparation is needed for this procedure. What happens during the procedure? You will undress and remove any jewelry around your neck or chest. You will put on a hospital gown. Sticky electrodes will be placed on your chest. The electrodes will be connected to an electrocardiogram (ECG) machine to record a tracing of the electrical activity of your heart. A CT scanner will take pictures of your  heart. During this time, you will be asked to lie still and hold your breath for 2-3 seconds while a picture of your heart is being taken. The procedure may vary among health care providers and hospitals. What happens after the procedure? You can get dressed. You can return to your normal activities. It is up to you to get the results of your test. Ask your health care provider, or the department that is doing the test, when your results will be ready. Summary A coronary calcium scan is an imaging test used to look for deposits of calcium and other fatty materials (plaques) in the inner lining of the blood vessels of the heart (coronary arteries). Generally, this is a safe procedure. Tell your health care provider if you are pregnant or may be pregnant. No preparation is needed for this procedure. A CT scanner will take pictures of your heart. You can return to your normal activities after the scan is done. This information is not intended to replace advice given to you by your health care provider. Make sure you discuss any questions you have with your health care provider. Document Released: 01/14/2008 Document Revised: 06/06/2016 Document Reviewed: 06/06/2016 Elsevier Interactive Patient Education  2017 [employer].   Follow-Up: At [facility], you and your health needs are our priority.  As part of our continuing mission to provide you with exceptional heart care, we have created designated Provider Care Teams.  These Care Teams include your primary Cardiologist (physician) and Advanced Practice Providers (APPs -  Physician Assistants and Nurse Practitioners) who all work together to provide you with the care you need, when  you need it.  You will need a follow up appointment as needed  Providers on your designated Care Team:   Lonni Meager, NP Bernardino Bring, PA-C Cadence [place], [place]    COVID-19 Vaccine Information can be found at:  [email or web address] For questions related to vaccine distribution or appointments, please email [email or web address] or call [phone].

## 2024-08-23 NOTE — Progress Notes (Signed)
 Cardiology Office Note  Date:  08/23/2024   ID:  Ashley Waller Oct 29, 1960, MRN [id number]  PCP:  Glendia Shad, MD   Chief Complaint  Patient presents with   New Patient (Initial Visit)    Ref by Dr. Shad Glendia for chest pain. Patient c/o chest pain that comes on at rest with having several episodes of the past two years with having more frequently spell of chest pain. Patient has also experienced two episodes of syncope over the past two years.     HPI:  Ashley Waller is a 64 y.o. female with past medical history of: Past Medical History:  Diagnosis Date   Abnormal mammogram 10/15/2012   Adult BMI 35.0-35.9 kg/sq m 01/31/2017   Arthritis    Asthma    Chest pain 03/13/2016   Health care maintenance 11/30/2014   Mammogram 12/22/15 - Birads 0.  Recommended f/u right breat mammogram.  Follow up right breast mammogram 01/11/16 - Birads I.  Recommended f/u mammogram in one year.      Hematuria    Hemorrhoid    History of colonic polyps 02/27/2015   Hypercholesterolemia 07/16/2012   Hypertension    Menopausal syndrome 07/22/2012   Plantar fasciitis of left foot 07/15/2013   Stress 05/17/2014  Who presents by referral from Dr. Shad Glendia for consultation of her chest pain and syncope  Prior events reviewed Approximately 3 years ago she reports she was started on amlodipine  Took a trip to the beach, sat at the beach during the daytime, drank alcohol drinks That evening, was cooking dinner at the time appreciated  tunnel vision, passed out for several seconds witnessed by her friend, woke up without injuring herself Better after sitting and drinking water  This summer 2025, no etoh Again went to the beach, unloaded items from her car, up and down the stairs, did not drink much while driving,   Friend had cut on her arm, was wrapping arm, blood visible Passed out after seeing wound  Active at baseline Reports having rare sensation in left chest, gas? Or pulled muscle? Told  in the past she has a murmur  No recent episodes of near syncope or syncope, no pain on exertion Denies shortness of breath  Labs reviewed A1C 5.4 Total chol 209 LDL 122  Mother with CVA Father with pulm fibrosis  EKG personally reviewed by myself on todays visit EKG Interpretation Date/Time:  Friday August 23 2024 10:00:09 EST Ventricular Rate:  79 PR Interval:  140 QRS Duration:  94 QT Interval:  386 QTC Calculation: 442 R Axis:   -18  Text Interpretation: Normal sinus rhythm normal ekg No previous ECGs available Confirmed by Perla Lye [phone]) on 08/23/2024 10:25:51 AM     PMH:   has a past medical history of Abnormal mammogram (10/15/2012), Adult BMI 35.0-35.9 kg/sq m (01/31/2017), Arthritis, Asthma, Chest pain (03/13/2016), Health care maintenance (11/30/2014), Hematuria, Hemorrhoid, History of colonic polyps (02/27/2015), Hypercholesterolemia (07/16/2012), Hypertension, Menopausal syndrome (07/22/2012), Plantar fasciitis of left foot (07/15/2013), and Stress (05/17/2014).   PSH:    Past Surgical History:  Procedure Laterality Date   BREAST BIOPSY Right 07/12/2023   u/s bx 2 3cmfn, venus clip path pending   BREAST BIOPSY Right 07/12/2023   US  RT BREAST BX W LOC DEV 1ST LESION IMG BX SPEC US  GUIDE 07/12/2023 [facility]   COLONOSCOPY  08/02/2011   EYELID LACERATION REPAIR  08/01/2004   TUBAL LIGATION  08/01/1993    Current Outpatient Medications  Medication Sig Dispense  Refill   acetaminophen (TYLENOL) 325 MG tablet Take 650 mg by mouth every 6 (six) hours as needed for pain.     albuterol  (VENTOLIN  HFA) 108 (90 Base) MCG/ACT inhaler Inhale 2 puffs into the lungs every 6 (six) hours as needed for wheezing or shortness of breath. 18 g 1   amLODipine  (NORVASC ) 5 MG tablet Take 1 tablet (5 mg total) by mouth daily. 90 tablet 1   BIOTIN PO Take by mouth.     Cetirizine HCl (ZYRTEC PO) Take by mouth.     fluticasone  (FLONASE ) 50 MCG/ACT nasal spray Place 2 sprays  into both nostrils daily. Hold until pt request 48 g 1   sertraline  (ZOLOFT ) 50 MG tablet Take 1 tablet (50 mg total) by mouth daily. 90 tablet 1   No current facility-administered medications for this visit.     Allergies:   Penicillins   Social History:  The patient  reports that she has never smoked. She has never used smokeless tobacco. She reports that she does not currently use alcohol after a past usage of about 1.0 standard drink of alcohol per week. She reports that she does not use drugs.   Family History:   family history includes Diabetes in her father and mother; Hyperlipidemia in her brother, father, and mother; Hypertension in her father and mother; Mental illness in her maternal grandfather, maternal grandmother, paternal grandfather, and paternal grandmother.    Review of Systems: Review of Systems  Constitutional: Negative.   HENT: Negative.    Respiratory: Negative.    Cardiovascular: Negative.   Gastrointestinal: Negative.   Musculoskeletal: Negative.   Neurological: Negative.   Psychiatric/Behavioral: Negative.    All other systems reviewed and are negative.  PHYSICAL EXAM: VS:  BP (!) 140/82 (BP Location: Right Arm, Patient Position: Sitting, Cuff Size: Normal)   Pulse 79   Ht 5' 1 (1.549 m)   Wt 176 lb (79.8 kg)   LMP 07/16/2009   SpO2 97%   BMI 33.25 kg/m  , BMI Body mass index is 33.25 kg/m. Constitutional:  oriented to person, place, and time. No distress.  HENT:  Head: Grossly normal Eyes:  no discharge. No scleral icterus.  Neck: No JVD, no carotid bruits  Cardiovascular: Regular rate and rhythm, no murmurs appreciated Pulmonary/Chest: Clear to auscultation bilaterally, no wheezes or rales Abdominal: Soft.  no distension.  no tenderness.  Musculoskeletal: Normal range of motion Neurological:  normal muscle tone. Coordination normal. No atrophy Skin: Skin warm and dry Psychiatric: normal affect, pleasant   Recent Labs: 08/13/2024: ALT 11;  BUN 10; Creatinine, Ser 0.54; Hemoglobin 14.2; Platelets 323.0; Potassium 3.6; Sodium 140; TSH 2.57    Lipid Panel Lab Results  Component Value Date   CHOL 209 (H) 08/13/2024   HDL 51.60 08/13/2024   LDLCALC 122 (H) 08/13/2024   TRIG 176.0 (H) 08/13/2024      Wt Readings from Last 3 Encounters:  08/23/24 176 lb (79.8 kg)  08/13/24 174 lb 6.4 oz (79.1 kg)  07/10/24 181 lb 6.4 oz (82.3 kg)     ASSESSMENT AND PLAN:  Problem List Items Addressed This Visit       Cardiology Problems   Hypercholesterolemia   Hypertension   Relevant Orders   EKG 12-Lead (Completed)     Other   Chest pain   Relevant Orders   EKG 12-Lead (Completed)   Hyperglycemia   Syncopal episodes - Primary   Relevant Orders   EKG 12-Lead (Completed)   History of  syncope First episode at the beach 3 years ago, suspect dehydration after laying on the beach with alcohol drinks, orthostasis while cooking dinner that night, recovered without intervention -More recent episode this past summer after arriving at the beach, unloading the car, did not drink much, looked at her friend's wound who was unwrapping her arm which was bloody, suspect vasovagal episode - recommend she hydrate especially on trips to the beach, no additional workup needed at this time -She is currently asymptomatic - For recurrent episodes could consider Zio monitor and echo  Atypical chest pain Presenting at rest, not with exertion Recommend she consider calcium scoring, she will call us  if interested  Hyperlipidemia Inclined not to be on medication Again suggested calcium scoring if she desires for risk ratification and management of her lipids  Essential hypertension Blood pressure is well controlled on today's visit. No changes made to the medications.  Seen in consultation for Dr. Allena Hamilton and will be referred back to her office for ongoing care of the issues detailed above  Signed, Velinda Lunger, M.D., Ph.D. [facility] [place], [place] [phone]

## 2024-09-03 ENCOUNTER — Other Ambulatory Visit

## 2024-09-03 ENCOUNTER — Encounter

## 2024-10-01 ENCOUNTER — Ambulatory Visit: Admitting: Internal Medicine

## 2024-10-03 ENCOUNTER — Encounter

## 2024-10-03 ENCOUNTER — Other Ambulatory Visit
